# Patient Record
Sex: Female | Born: 1980 | Race: Black or African American | Marital: Single | Smoking: Current every day smoker
Health system: Southern US, Community
[De-identification: ages and names within clinical notes are randomized; demographics above are authoritative.]

## PROBLEM LIST (undated history)

## (undated) DIAGNOSIS — F329 Major depressive disorder, single episode, unspecified: Secondary | ICD-10-CM

## (undated) DIAGNOSIS — F319 Bipolar disorder, unspecified: Secondary | ICD-10-CM

## (undated) DIAGNOSIS — J45909 Unspecified asthma, uncomplicated: Secondary | ICD-10-CM

## (undated) DIAGNOSIS — F419 Anxiety disorder, unspecified: Secondary | ICD-10-CM

## (undated) DIAGNOSIS — F32A Depression, unspecified: Secondary | ICD-10-CM

## (undated) HISTORY — PX: HERNIA REPAIR: SHX51

## (undated) HISTORY — PX: GANGLION CYST EXCISION: SHX1691

---

## 2012-02-21 ENCOUNTER — Encounter (HOSPITAL_COMMUNITY): Payer: Self-pay | Admitting: *Deleted

## 2012-02-21 ENCOUNTER — Emergency Department (HOSPITAL_COMMUNITY)
Admission: EM | Admit: 2012-02-21 | Discharge: 2012-02-21 | Disposition: A | Discharge status: Home / Self Care | Payer: Medicaid Other | Attending: Emergency Medicine | Admitting: Emergency Medicine

## 2012-02-21 DIAGNOSIS — S0501XA Injury of conjunctiva and corneal abrasion without foreign body, right eye, initial encounter: Secondary | ICD-10-CM

## 2012-02-21 DIAGNOSIS — H18829 Corneal disorder due to contact lens, unspecified eye: Secondary | ICD-10-CM | POA: Insufficient documentation

## 2012-02-21 DIAGNOSIS — Z888 Allergy status to other drugs, medicaments and biological substances status: Secondary | ICD-10-CM | POA: Insufficient documentation

## 2012-02-21 DIAGNOSIS — S058X9A Other injuries of unspecified eye and orbit, initial encounter: Secondary | ICD-10-CM | POA: Insufficient documentation

## 2012-02-21 MED ORDER — OXYCODONE-ACETAMINOPHEN 10-325 MG PO TABS: 1.0000 | ORAL_TABLET | ORAL | Status: DC | PRN

## 2012-02-21 MED ORDER — FLUORESCEIN SODIUM 1 MG OP STRP
1.0000 | ORAL_STRIP | Freq: Once | OPHTHALMIC | Status: AC
  Administered 2012-02-21: 2 via OPHTHALMIC
  Filled 2012-02-21: qty 2

## 2012-02-21 MED ORDER — IBUPROFEN 400 MG PO TABS
400.0000 mg | ORAL_TABLET | Freq: Once | ORAL | Status: AC
  Administered 2012-02-21: 400 mg via ORAL
  Filled 2012-02-21: qty 1

## 2012-02-21 MED ORDER — ONDANSETRON 4 MG PO TBDP
8.0000 mg | ORAL_TABLET | Freq: Once | ORAL | Status: AC
  Administered 2012-02-21: 8 mg via ORAL
  Filled 2012-02-21: qty 2

## 2012-02-21 MED ORDER — TOBRAMYCIN 0.3 % OP SOLN
2.0000 [drp] | Freq: Four times a day (QID) | OPHTHALMIC | Status: DC
  Administered 2012-02-21: 2 [drp] via OPHTHALMIC
  Filled 2012-02-21: qty 5

## 2012-02-21 MED ORDER — TETRACAINE HCL 0.5 % OP SOLN
2.0000 [drp] | Freq: Once | OPHTHALMIC | Status: AC
  Administered 2012-02-21: 2 [drp] via OPHTHALMIC
  Filled 2012-02-21: qty 2

## 2012-02-21 MED ORDER — OXYCODONE-ACETAMINOPHEN 5-325 MG PO TABS
1.0000 | ORAL_TABLET | Freq: Once | ORAL | Status: AC
  Administered 2012-02-21: 1 via ORAL
  Filled 2012-02-21: qty 1

## 2012-02-21 NOTE — ED Provider Notes (Signed)
Medical screening examination/treatment/procedure(s) were performed by non-physician practitioner and as supervising physician I was immediately available for consultation/collaboration.  Gianlucas Evenson M Ketsia Linebaugh, MD 02/21/12 0628 

## 2012-02-21 NOTE — ED Notes (Signed)
PA gave gtt to each eye for continued eye pain

## 2012-02-21 NOTE — ED Notes (Signed)
Pt dc to home.  W/c to car.  Pt a/o states understanding to dc instructions.

## 2012-02-21 NOTE — ED Notes (Signed)
Patient states that she is unable to open her left eye.  Patient informed of the need for a visual acuity screening.  Patient states she is unable to complete one at this time.  RN Roe Coombs informed.

## 2012-02-21 NOTE — ED Notes (Signed)
The pt is c/o bi-lateral eye pain she went to sleep with soft contacts in place .  She woke up   And for  2 hours she has pain and  And cannot see for crying now.

## 2012-02-21 NOTE — ED Provider Notes (Signed)
History     CSN: [id number]  Arrival date & time 02/21/12  4098   First MD Initiated Contact with Patient 02/21/12 0340      Chief Complaint  Patient presents with  . Eye Pain   HPI  History provided by the patient. Patient is a 31 year old female with no significant PMH who presents with complaints of bilateral eye pain. Symptoms began to 3 hours prior to arrival. Patient states that she fell asleep with her contact lenses and awoke with severe pains in both eyes. Pain is worse with bright lights and trying to open her eyes. Symptoms are associated with tearing and blurred vision. Patient also reports slight nausea symptoms. She denies having similar symptoms previously. Patient states her contact lenses were relatively new. She denies any injury or trauma.    History reviewed. No pertinent past medical history.  History reviewed. No pertinent past surgical history.  No family history on file.  History  Substance Use Topics  . Smoking status: Never Smoker   . Smokeless tobacco: Not on file  . Alcohol Use: No    OB History    Grav Para Term Preterm Abortions TAB SAB Ect Mult Living                  Review of Systems  Constitutional: Negative for fever and chills.  HENT: Positive for rhinorrhea.   Eyes: Positive for photophobia, pain, discharge and redness. Negative for itching.  Gastrointestinal: Positive for nausea. Negative for vomiting and abdominal pain.  Skin: Negative for rash.  Neurological: Positive for headaches.    Allergies  Tramadol  Home Medications  No current outpatient prescriptions on file.  BP 123/87  Pulse 92  Temp 97.9 F (36.6 C) (Oral)  Resp 18  SpO2 98%  Physical Exam  Nursing note and vitals reviewed. Constitutional: She is oriented to person, place, and time. She appears well-developed and well-nourished. No distress.  HENT:  Head: Normocephalic.  Eyes: EOM are normal. Pupils are equal, round, and reactive to light. Right eye  exhibits discharge. Right eye exhibits no chemosis and no exudate. Left eye exhibits discharge. Left eye exhibits no chemosis and no exudate. Right conjunctiva is injected. Right conjunctiva has no hemorrhage. Left conjunctiva is injected. Left conjunctiva has no hemorrhage.         There is fluorescein uptake around the superior edges of the cornea on bilateral eyes.  Cardiovascular: Normal rate and regular rhythm.   Pulmonary/Chest: Effort normal and breath sounds normal.  Abdominal: Soft.  Neurological: She is alert and oriented to person, place, and time.  Skin: Skin is warm and dry. No rash noted.  Psychiatric: She has a normal mood and affect. Her behavior is normal.    ED Course  Procedures     1. Corneal abrasion due to contact lens   2. Corneal abrasion of both eyes       MDM  3:50AM patient seen and evaluated. Patient appears significantly uncomfortable.   Patient having significant improvement after Percocet and tetracaine drops. Patient now resting and sleeping.     Angus Seller, [place] 02/21/12 [phone]

## 2012-09-17 ENCOUNTER — Encounter (HOSPITAL_COMMUNITY): Payer: Self-pay | Admitting: Adult Health

## 2012-09-17 ENCOUNTER — Emergency Department (HOSPITAL_COMMUNITY)
Admission: EM | Admit: 2012-09-17 | Discharge: 2012-09-17 | Disposition: A | Discharge status: Home / Self Care | Payer: Self-pay | Attending: Emergency Medicine | Admitting: Emergency Medicine

## 2012-09-17 DIAGNOSIS — L0291 Cutaneous abscess, unspecified: Secondary | ICD-10-CM

## 2012-09-17 DIAGNOSIS — J45909 Unspecified asthma, uncomplicated: Secondary | ICD-10-CM | POA: Insufficient documentation

## 2012-09-17 DIAGNOSIS — L0231 Cutaneous abscess of buttock: Secondary | ICD-10-CM | POA: Insufficient documentation

## 2012-09-17 DIAGNOSIS — F17200 Nicotine dependence, unspecified, uncomplicated: Secondary | ICD-10-CM | POA: Insufficient documentation

## 2012-09-17 DIAGNOSIS — R6883 Chills (without fever): Secondary | ICD-10-CM | POA: Insufficient documentation

## 2012-09-17 DIAGNOSIS — R110 Nausea: Secondary | ICD-10-CM | POA: Insufficient documentation

## 2012-09-17 HISTORY — DX: Unspecified asthma, uncomplicated: J45.909

## 2012-09-17 MED ORDER — HYDROCODONE-ACETAMINOPHEN 5-325 MG PO TABS: 1.0000 | ORAL_TABLET | ORAL | Status: DC | PRN

## 2012-09-17 MED ORDER — OXYCODONE-ACETAMINOPHEN 5-325 MG PO TABS
1.0000 | ORAL_TABLET | Freq: Once | ORAL | Status: AC
  Administered 2012-09-17: 1 via ORAL
  Filled 2012-09-17: qty 1

## 2012-09-17 MED ORDER — ONDANSETRON 4 MG PO TBDP
8.0000 mg | ORAL_TABLET | Freq: Once | ORAL | Status: AC
  Administered 2012-09-17: 8 mg via ORAL
  Filled 2012-09-17: qty 2

## 2012-09-17 MED ORDER — SULFAMETHOXAZOLE-TRIMETHOPRIM 800-160 MG PO TABS: 1.0000 | ORAL_TABLET | Freq: Two times a day (BID) | ORAL | Status: DC

## 2012-09-17 NOTE — ED Notes (Signed)
Pt reports being bitten on right buttock by "a bug" yesterday. States that pain around bug bite has gotten worse and pain is now radiating to lower back and right lower leg. Reports taking ibuprofen yesterday with no relief and received a percocet while in triage and still is in severe pain.

## 2012-09-17 NOTE — ED Provider Notes (Signed)
History    This chart was scribed for non-physician practitioner Dahlia Client Michelle Mills working with Michelle Human, MD by Quintella Reichert, ED Scribe. This patient was seen in room TR08C/TR08C and the patient's care was started at 8:36 PM .   CSN: [id number]  Arrival date & time 09/17/12  1800       Chief Complaint  Patient presents with  . Insect Bite     The history is provided by the patient. No language interpreter was used.    HPI Comments: Michelle Mills is a 32 y.o. female who presents to the Emergency Department complaining of an apparent animal bite that pt sustained 2 nights ago.  Pt states that sore is painful and that area around it is tender.  She also notes chills and nausea.  Pt denies fever or any other symptoms.  She attempted to treat pain with ibuprofen and hot compress, which did not relieve pain.  She states pain was exacerbated by hot compress.  She reports a h/o similar symptoms associated with a spider bite that pt sustained in the past, which was successfully treated with antibiotics.  Pt has asthma but has not been medicated for this recently.  Pt is allergic to tramadol.   Past Medical History  Diagnosis Date  . Asthma     History reviewed. No pertinent past surgical history.  History reviewed. No pertinent family history.  History  Substance Use Topics  . Smoking status: Current Every Day Smoker    Types: Cigarettes  . Smokeless tobacco: Not on file  . Alcohol Use: No    OB History   Grav Para Term Preterm Abortions TAB SAB Ect Mult Living                  Review of Systems  Constitutional: Positive for chills. Negative for fever.  Gastrointestinal: Positive for nausea.  Skin: Positive for wound.  All other systems reviewed and are negative.    Allergies  Tramadol  Home Medications   Current Outpatient Rx  Name  Route  Sig  Dispense  Refill  . etonogestrel (IMPLANON) 68 MG IMPL implant   Subcutaneous   Inject 1 each into the  skin once.         Marland Kitchen HYDROcodone-acetaminophen (NORCO/VICODIN) 5-325 MG per tablet   Oral   Take 1 tablet by mouth every 4 (four) hours as needed for pain.   6 tablet   0   . sulfamethoxazole-trimethoprim (SEPTRA DS) 800-160 MG per tablet   Oral   Take 1 tablet by mouth every 12 (twelve) hours.   20 tablet   0     BP 120/67  Pulse 90  Temp(Src) 97.7 F (36.5 C) (Oral)  Resp 16  SpO2 99%  Physical Exam  Nursing note and vitals reviewed. Constitutional: She is oriented to person, place, and time. She appears well-developed and well-nourished. No distress.  HENT:  Head: Normocephalic and atraumatic.  Mouth/Throat: Oropharynx is clear and moist. No oropharyngeal exudate.  Eyes: Conjunctivae are normal. No scleral icterus.  Neck: Normal range of motion.  Cardiovascular: Normal rate, regular rhythm, normal heart sounds and intact distal pulses.   No murmur heard. Pulmonary/Chest: Effort normal and breath sounds normal. No respiratory distress. She has no wheezes.  Musculoskeletal: Normal range of motion. She exhibits no edema.  Neurological: She is alert and oriented to person, place, and time. Coordination normal.  Speech is clear and goal oriented Moves extremities without ataxia  Skin: Skin is warm  and dry. She is not diaphoretic. There is erythema.  3x3 cm area of erythema and induration top of right buttock, tender to palpation.  Mild excoriation.  Psychiatric: She has a normal mood and affect.    ED Course  INCISION AND DRAINAGE Date/Time: 09/17/2012 9:19 PM Performed by: Dierdre Forth Authorized by: Dierdre Forth Consent: Verbal consent obtained. Risks and benefits: risks, benefits and alternatives were discussed Consent given by: patient Patient understanding: patient states understanding of the procedure being performed Patient consent: the patient's understanding of the procedure matches consent given Procedure consent: procedure consent matches  procedure scheduled Relevant documents: relevant documents present and verified Site marked: the operative site was marked Required items: required blood products, implants, devices, and special equipment available Patient identity confirmed: verbally with patient and arm band Type: abscess Body area: anogenital (right buttock) Anesthesia: local infiltration Local anesthetic: lidocaine 1% without epinephrine Anesthetic total: 4 ml Patient sedated: no Scalpel size: 11 Incision type: single straight Complexity: complex Drainage: purulent Drainage amount: moderate Wound treatment: wound left open Patient tolerance: Patient tolerated the procedure well with no immediate complications.   (including critical care time)  DIAGNOSTIC STUDIES: Oxygen Saturation is 99% on room air, normal by my interpretation.    COORDINATION OF CARE: 8:40 PM-Discussed treatment plan which includes anti-emetics, labs and incision and drainage with pt at bedside and pt agreed to plan.      Labs Reviewed  GLUCOSE, CAPILLARY   No results found.   1. Abscess       MDM  Teana Lindahl presents with skin abscess that could have been an insect bite, but does not have necrotic tissue suggestive of brown recluse bite.  Patient with skin abscess amenable to incision and drainage.  Abscess was not large enough to warrant packing or drain,  wound recheck in 2 days. Encouraged home warm soaks and flushing.  Mild signs of cellulitis is surrounding skin.  Will d/c to home with bactrim and pain medication.  Pt without Hx of diabetes and CBG normal here.  Pt with hx of asthma, but no steroid use, no immunomodulators and no risky behaviors concerning for HIV.  I have also discussed reasons to return immediately to the ER.  Patient expresses understanding and agrees with plan.   I personally performed the services described in this documentation, which was scribed in my presence. The recorded information has been  reviewed and is accurate.   Dahlia Client Michelle Fines, PA-C 09/17/12 2122

## 2012-09-17 NOTE — ED Notes (Signed)
Presents with induration to right buttock that began weds. Pt staes, "it started small and then yesterday it looked like I had scratched it, but I didin't and today there is so much pain I had to call out of work" no drainage noted.

## 2012-09-18 NOTE — ED Provider Notes (Signed)
Medical screening examination/treatment/procedure(s) were performed by non-physician practitioner and as supervising physician I was immediately available for consultation/collaboration.   Efosa Treichler III, MD 09/18/12 1228 

## 2014-03-29 ENCOUNTER — Emergency Department (HOSPITAL_COMMUNITY)
Admission: EM | Admit: 2014-03-29 | Discharge: 2014-03-30 | Disposition: A | Discharge status: Home / Self Care | Payer: Medicaid Other | Attending: Emergency Medicine | Admitting: Emergency Medicine

## 2014-03-29 ENCOUNTER — Emergency Department (HOSPITAL_COMMUNITY): Payer: Medicaid Other

## 2014-03-29 ENCOUNTER — Encounter (HOSPITAL_COMMUNITY): Payer: Self-pay | Admitting: Emergency Medicine

## 2014-03-29 DIAGNOSIS — R1310 Dysphagia, unspecified: Secondary | ICD-10-CM | POA: Insufficient documentation

## 2014-03-29 DIAGNOSIS — R072 Precordial pain: Secondary | ICD-10-CM | POA: Insufficient documentation

## 2014-03-29 DIAGNOSIS — Z3202 Encounter for pregnancy test, result negative: Secondary | ICD-10-CM | POA: Insufficient documentation

## 2014-03-29 DIAGNOSIS — J45909 Unspecified asthma, uncomplicated: Secondary | ICD-10-CM | POA: Insufficient documentation

## 2014-03-29 DIAGNOSIS — R079 Chest pain, unspecified: Secondary | ICD-10-CM

## 2014-03-29 DIAGNOSIS — R1013 Epigastric pain: Secondary | ICD-10-CM | POA: Insufficient documentation

## 2014-03-29 DIAGNOSIS — Z720 Tobacco use: Secondary | ICD-10-CM | POA: Insufficient documentation

## 2014-03-29 LAB — LIPASE, BLOOD: Lipase: 16 U/L (ref 11–59)

## 2014-03-29 LAB — BASIC METABOLIC PANEL
Anion gap: 12 (ref 5–15)
BUN: 12 mg/dL (ref 6–23)
CHLORIDE: 104 meq/L (ref 96–112)
CO2: 26 mEq/L (ref 19–32)
CREATININE: 0.65 mg/dL (ref 0.50–1.10)
Calcium: 9.1 mg/dL (ref 8.4–10.5)
GFR calc non Af Amer: >90 mL/min (ref >90)
Glucose, Bld: 93 mg/dL (ref 70–99)
Potassium: 3.7 mEq/L (ref 3.7–5.3)
Sodium: 142 mEq/L (ref 137–147)

## 2014-03-29 LAB — URINALYSIS, ROUTINE W REFLEX MICROSCOPIC
Bilirubin Urine: NEGATIVE
GLUCOSE, UA: NEGATIVE mg/dL
HGB URINE DIPSTICK: NEGATIVE
Ketones, ur: NEGATIVE mg/dL
Leukocytes, UA: NEGATIVE
Nitrite: NEGATIVE
Protein, ur: 100 mg/dL — AB
SPECIFIC GRAVITY, URINE: 1.025 (ref 1.005–1.030)
Urobilinogen, UA: 1 mg/dL (ref 0.0–1.0)
pH: 8.5 — ABNORMAL HIGH (ref 5.0–8.0)

## 2014-03-29 LAB — CBC
HEMATOCRIT: 37.1 % (ref 36.0–46.0)
HEMOGLOBIN: 12.3 g/dL (ref 12.0–15.0)
MCH: 28.1 pg (ref 26.0–34.0)
MCHC: 33.2 g/dL (ref 30.0–36.0)
MCV: 84.7 fL (ref 78.0–100.0)
Platelets: 257 10*3/uL (ref 150–400)
RBC: 4.38 MIL/uL (ref 3.87–5.11)
RDW: 13.9 % (ref 11.5–15.5)
WBC: 8 10*3/uL (ref 4.0–10.5)

## 2014-03-29 LAB — HEPATIC FUNCTION PANEL
ALBUMIN: 3.4 g/dL — AB (ref 3.5–5.2)
ALK PHOS: 75 U/L (ref 39–117)
ALT: 12 U/L (ref 0–35)
AST: 13 U/L (ref 0–37)
Bilirubin, Direct: <0.2 mg/dL (ref 0.0–0.3)
TOTAL PROTEIN: 6.7 g/dL (ref 6.0–8.3)
Total Bilirubin: <0.2 mg/dL — ABNORMAL LOW (ref 0.3–1.2)

## 2014-03-29 LAB — URINE MICROSCOPIC-ADD ON

## 2014-03-29 LAB — POC URINE PREG, ED: Preg Test, Ur: NEGATIVE

## 2014-03-29 LAB — I-STAT TROPONIN, ED: TROPONIN I, POC: 0.01 ng/mL (ref 0.00–0.08)

## 2014-03-29 LAB — PRO B NATRIURETIC PEPTIDE: Pro B Natriuretic peptide (BNP): 93.1 pg/mL (ref 0–125)

## 2014-03-29 MED ORDER — GI COCKTAIL ~~LOC~~
30.0000 mL | Freq: Once | ORAL | Status: AC
  Administered 2014-03-29: 30 mL via ORAL
  Filled 2014-03-29: qty 30

## 2014-03-29 MED ORDER — PANTOPRAZOLE SODIUM 40 MG PO TBEC
40.0000 mg | DELAYED_RELEASE_TABLET | Freq: Once | ORAL | Status: AC
  Administered 2014-03-29: 40 mg via ORAL
  Filled 2014-03-29: qty 1

## 2014-03-29 MED ORDER — MORPHINE SULFATE 4 MG/ML IJ SOLN: 4.0000 mg | Freq: Once | INTRAMUSCULAR | Status: DC

## 2014-03-29 MED ORDER — MORPHINE SULFATE 4 MG/ML IJ SOLN
6.0000 mg | Freq: Once | INTRAMUSCULAR | Status: AC
  Administered 2014-03-29: 6 mg via INTRAMUSCULAR
  Filled 2014-03-29: qty 2

## 2014-03-29 NOTE — ED Provider Notes (Signed)
CSN: [id number]     Arrival date & time 03/29/14  1739 History   First MD Initiated Contact with Patient 03/29/14 2128     Chief Complaint  Patient presents with  . Chest Pain     (Consider location/radiation/quality/duration/timing/severity/associated sxs/prior Treatment) Patient is a 33 y.o. female presenting with chest pain.  Chest Pain Pain location:  Substernal area Pain quality: aching and pressure   Pain radiates to:  Upper back Pain radiates to the back: yes   Pain severity:  Moderate Onset quality:  Sudden Duration:  14 hours Timing:  Constant Progression:  Unchanged Chronicity:  New Context: at rest   Relieved by:  Nothing Worsened by:  Deep breathing (eating and drinking, swallowing) Associated symptoms: abdominal pain and dysphagia   Associated symptoms: no anorexia, no anxiety, no back pain, no claudication, no cough, no diaphoresis, no fatigue, no fever, no headache, no lower extremity edema, no nausea, no numbness, no palpitations, no shortness of breath, not vomiting and no weakness   Risk factors: smoking   Risk factors: no aortic disease, no birth control, no coronary artery disease, no diabetes mellitus, no high cholesterol, no hypertension, no immobilization, not female, not obese, not pregnant and no prior DVT/PE     Past Medical History  Diagnosis Date  . Asthma    History reviewed. No pertinent past surgical history. No family history on file. History  Substance Use Topics  . Smoking status: Current Every Day Smoker -- 0.50 packs/day    Types: Cigarettes  . Smokeless tobacco: Not on file  . Alcohol Use: No   OB History    No data available     Review of Systems  Constitutional: Negative for fever, chills, diaphoresis, activity change, appetite change and fatigue.  HENT: Positive for trouble swallowing. Negative for congestion, facial swelling, rhinorrhea and sore throat.   Eyes: Negative for photophobia and discharge.  Respiratory: Negative  for cough, chest tightness and shortness of breath.   Cardiovascular: Positive for chest pain. Negative for palpitations, claudication and leg swelling.  Gastrointestinal: Positive for abdominal pain. Negative for nausea, vomiting, diarrhea and anorexia.  Endocrine: Negative for polydipsia and polyuria.  Genitourinary: Negative for dysuria, frequency, difficulty urinating and pelvic pain.  Musculoskeletal: Negative for back pain, arthralgias, neck pain and neck stiffness.  Skin: Negative for color change and wound.  Allergic/Immunologic: Negative for immunocompromised state.  Neurological: Negative for facial asymmetry, weakness, numbness and headaches.  Hematological: Does not bruise/bleed easily.  Psychiatric/Behavioral: Negative for confusion and agitation.      Allergies  Tramadol  Home Medications   Prior to Admission medications   Medication Sig Start Date End Date Taking? Authorizing Provider  etonogestrel (IMPLANON) 68 MG IMPL implant Inject 1 each into the skin once.   Yes Historical Provider, MD  Hyprom-Naphaz-Polysorb-Zn Sulf (CLEAR EYES COMPLETE OP) Apply 1-2 drops to eye daily as needed (dry eyes/contacts.).   Yes Historical Provider, MD  ibuprofen (ADVIL,MOTRIN) 200 MG tablet Take 600 mg by mouth every 4 (four) hours as needed for headache or moderate pain.   Yes Historical Provider, MD  HYDROcodone-acetaminophen (NORCO/VICODIN) 5-325 MG per tablet Take 1 tablet by mouth every 4 (four) hours as needed for pain. Patient not taking: Reported on 03/29/2014 09/17/12   Dahlia Client Muthersbaugh, PA-C  sulfamethoxazole-trimethoprim (SEPTRA DS) 800-160 MG per tablet Take 1 tablet by mouth every 12 (twelve) hours. Patient not taking: Reported on 03/29/2014 09/17/12   Dahlia Client Muthersbaugh, PA-C   BP 120/83 mmHg  Pulse 78  Temp(Src) 97.9 F (36.6 C) (Oral)  Resp 18  SpO2 100%  LMP 01/27/2014 Physical Exam  Constitutional: She is oriented to person, place, and time. She appears  well-developed and well-nourished. No distress.  HENT:  Head: Normocephalic and atraumatic.  Mouth/Throat: No oropharyngeal exudate.  Eyes: Pupils are equal, round, and reactive to light.  Neck: Normal range of motion. Neck supple.  Cardiovascular: Normal rate, regular rhythm and normal heart sounds.  Exam reveals no gallop and no friction rub.   No murmur heard. Pulmonary/Chest: Effort normal and breath sounds normal. No respiratory distress. She has no wheezes. She has no rales.    Abdominal: Soft. Bowel sounds are normal. She exhibits no distension and no mass. There is tenderness in the epigastric area. There is no rebound and no guarding.  Musculoskeletal: Normal range of motion. She exhibits no edema or tenderness.  Neurological: She is alert and oriented to person, place, and time.  Skin: Skin is warm and dry.  Psychiatric: She has a normal mood and affect.    ED Course  Procedures (including critical care time) Labs Review Labs Reviewed  URINALYSIS, ROUTINE W REFLEX MICROSCOPIC - Abnormal; Notable for the following:    APPearance TURBID (*)    pH 8.5 (*)    Protein, ur 100 (*)    All other components within normal limits  URINE MICROSCOPIC-ADD ON - Abnormal; Notable for the following:    Bacteria, UA MANY (*)    Casts HYALINE CASTS (*)    All other components within normal limits  PRO B NATRIURETIC PEPTIDE  BASIC METABOLIC PANEL  CBC  HEPATIC FUNCTION PANEL  LIPASE, BLOOD  I-STAT TROPOININ, ED  POC URINE PREG, ED    Imaging Review Dg Chest 2 View  03/29/2014   CLINICAL DATA:  Mid chest pain and pain under both breasts, shortness of breath and cough since this morning, smoker, history asthma, initial encounter  EXAM: CHEST  2 VIEW  COMPARISON:  None  FINDINGS: Normal heart size, mediastinal contours, and pulmonary vascularity.  Lungs clear.  No pneumothorax.  Bones unremarkable.  IMPRESSION: Normal exam.   Electronically Signed   By: Ulyses SouthwardMark  Boles M.D.   On: 03/29/2014  22:15     EKG Interpretation   Date/Time:  Wednesday March 29 2014 17:46:17 EST Ventricular Rate:  90 PR Interval:  137 QRS Duration: 83 QT Interval:  349 QTC Calculation: 427 R Axis:   21 Text Interpretation:  Sinus rhythm Abnormal ekg Confirmed by Shevawn Langenberg  MD,  Rayleigh Gillyard ([id number]) on 03/29/2014 11:25:26 PM      MDM   Final diagnoses:  Chest pain   Pt is a 33 y.o. female with Pmhx as above who presents with substernal CP since 9am, constant, worse with swallowing, eating, drinking or taking deep breaths.  She denies fevers, chills, coughing, shortness of breath, leg pain or swelling.  She's had no suspicious food intake, possible sick contacts at work.  On physical exam vital signs are stable.  Patient's in no acute distress.  She is reproducible central chest pain as well as epigastric pain.  Oropharynx is clear.  EKG has no acute ischemic changes.  Troponin is negative.  Chest x-ray is normal.  BNP is not elevated.  Given that the pain is worse when she eats or drinks I suspect that is GI in nature.  I doubt PE, given normal heart rate, no shortness of breath, no leg pain or swelling and no recent travel history.  Protonix and a  GI cocktail given without improvement. IM morphine given  12:03 AM Pt is improving, though pt feels nauseated. Will give second dose morphine & add zofran.    12:43 AM Pt feeling improved, has tolerated soda and crackers. Lipase & LFTs nml. Will d/c home w/ norco and protonix for symptoms.  Return precautions given for new or worsening symptoms including worsneing pain, fever, SOB, inability to tolerate liquids.    Toy CookeyMegan Reyanne Hussar, MD 03/30/14 [phone]

## 2014-03-29 NOTE — ED Notes (Signed)
Per pt left side chest pain starting 0900 today. Pt reports dizziness, SOB, nausea, and back pain associated with left side chest pain. Pt alert and oriented x4.

## 2014-03-30 MED ORDER — MORPHINE SULFATE 4 MG/ML IJ SOLN
6.0000 mg | Freq: Once | INTRAMUSCULAR | Status: AC
  Administered 2014-03-30: 6 mg via INTRAMUSCULAR
  Filled 2014-03-30: qty 2

## 2014-03-30 MED ORDER — PANTOPRAZOLE SODIUM 40 MG PO TBEC: 40.0000 mg | DELAYED_RELEASE_TABLET | Freq: Every day | ORAL | Status: DC

## 2014-03-30 MED ORDER — HYDROCODONE-ACETAMINOPHEN 5-325 MG PO TABS: 1.0000 | ORAL_TABLET | Freq: Four times a day (QID) | ORAL | Status: AC | PRN

## 2014-03-30 MED ORDER — PANTOPRAZOLE SODIUM 40 MG PO TBEC: 40.0000 mg | DELAYED_RELEASE_TABLET | Freq: Every day | ORAL | Status: AC

## 2014-03-30 MED ORDER — ONDANSETRON HCL 4 MG/2ML IJ SOLN: 4.0000 mg | Freq: Once | INTRAMUSCULAR | Status: DC

## 2014-03-30 MED ORDER — ONDANSETRON 4 MG PO TBDP
4.0000 mg | ORAL_TABLET | Freq: Once | ORAL | Status: AC
  Administered 2014-03-30: 4 mg via ORAL
  Filled 2014-03-30: qty 1

## 2014-03-30 MED ORDER — HYDROCODONE-ACETAMINOPHEN 5-325 MG PO TABS: 1.0000 | ORAL_TABLET | Freq: Four times a day (QID) | ORAL | Status: DC | PRN

## 2014-03-30 MED ORDER — MORPHINE SULFATE 4 MG/ML IJ SOLN: 4.0000 mg | Freq: Once | INTRAMUSCULAR | Status: DC

## 2014-07-09 ENCOUNTER — Emergency Department (HOSPITAL_COMMUNITY)
Admission: EM | Admit: 2014-07-09 | Discharge: 2014-07-09 | Disposition: A | Discharge status: Home / Self Care | Payer: Medicaid Other | Attending: Emergency Medicine | Admitting: Emergency Medicine

## 2014-07-09 ENCOUNTER — Encounter (HOSPITAL_COMMUNITY): Payer: Self-pay | Admitting: Emergency Medicine

## 2014-07-09 DIAGNOSIS — Z79899 Other long term (current) drug therapy: Secondary | ICD-10-CM | POA: Insufficient documentation

## 2014-07-09 DIAGNOSIS — Z720 Tobacco use: Secondary | ICD-10-CM | POA: Insufficient documentation

## 2014-07-09 DIAGNOSIS — Z8659 Personal history of other mental and behavioral disorders: Secondary | ICD-10-CM | POA: Insufficient documentation

## 2014-07-09 DIAGNOSIS — J45909 Unspecified asthma, uncomplicated: Secondary | ICD-10-CM | POA: Insufficient documentation

## 2014-07-09 DIAGNOSIS — H6503 Acute serous otitis media, bilateral: Secondary | ICD-10-CM | POA: Insufficient documentation

## 2014-07-09 DIAGNOSIS — J029 Acute pharyngitis, unspecified: Secondary | ICD-10-CM | POA: Insufficient documentation

## 2014-07-09 HISTORY — DX: Depression, unspecified: F32.A

## 2014-07-09 HISTORY — DX: Anxiety disorder, unspecified: F41.9

## 2014-07-09 HISTORY — DX: Bipolar disorder, unspecified: F31.9

## 2014-07-09 HISTORY — DX: Major depressive disorder, single episode, unspecified: F32.9

## 2014-07-09 LAB — RAPID STREP SCREEN (MED CTR MEBANE ONLY): STREPTOCOCCUS, GROUP A SCREEN (DIRECT): NEGATIVE

## 2014-07-09 MED ORDER — NAPROXEN 500 MG PO TABS: 500.0000 mg | ORAL_TABLET | Freq: Two times a day (BID) | ORAL | Status: AC

## 2014-07-09 MED ORDER — OXYCODONE-ACETAMINOPHEN 5-325 MG PO TABS: 1.0000 | ORAL_TABLET | ORAL | Status: AC | PRN

## 2014-07-09 MED ORDER — AMOXICILLIN-POT CLAVULANATE 875-125 MG PO TABS
1.0000 | ORAL_TABLET | Freq: Two times a day (BID) | ORAL | Status: AC
Start: 2014-07-09 — End: ?

## 2014-07-09 NOTE — Discharge Instructions (Signed)
You will need to follow up with your regular doctor in one week to be sure the ear infection is improving. Return here as needed for worsening symptoms.   Otitis Media Otitis media is redness, soreness, and inflammation of the middle ear. Otitis media may be caused by allergies or, most commonly, by infection. Often it occurs as a complication of the common cold. SIGNS AND SYMPTOMS Symptoms of otitis media may include:  Earache.  Fever.  Ringing in your ear.  Headache.  Leakage of fluid from the ear. DIAGNOSIS To diagnose otitis media, your health care provider will examine your ear with an otoscope. This is an instrument that allows your health care provider to see into your ear in order to examine your eardrum. Your health care provider also will ask you questions about your symptoms. TREATMENT  Typically, otitis media resolves on its own within 3-5 days. Your health care provider may prescribe medicine to ease your symptoms of pain. If otitis media does not resolve within 5 days or is recurrent, your health care provider may prescribe antibiotic medicines if he or she suspects that a bacterial infection is the cause. HOME CARE INSTRUCTIONS   If you were prescribed an antibiotic medicine, finish it all even if you start to feel better.  Take medicines only as directed by your health care provider.  Keep all follow-up visits as directed by your health care provider. SEEK MEDICAL CARE IF:  You have otitis media only in one ear, or bleeding from your nose, or both.  You notice a lump on your neck.  You are not getting better in 3-5 days.  You feel worse instead of better. SEEK IMMEDIATE MEDICAL CARE IF:   You have pain that is not controlled with medicine.  You have swelling, redness, or pain around your ear or stiffness in your neck.  You notice that part of your face is paralyzed.  You notice that the bone behind your ear (mastoid) is tender when you touch it. MAKE SURE  YOU:   Understand these instructions.  Will watch your condition.  Will get help right away if you are not doing well or get worse. Document Released: 02/01/2004 Document Revised: 09/12/2013 Document Reviewed: 11/23/2012 [facility] Patient Information 2015 [place], [place]. This information is not intended to replace advice given to you by your health care provider. Make sure you discuss any questions you have with your health care provider.

## 2014-07-09 NOTE — ED Notes (Signed)
Pt reports sore throat x 6 days, and severe bilateral ear pain since this am. tx with motrin. Denies fever

## 2014-07-09 NOTE — ED Provider Notes (Signed)
CSN: [id number]     Arrival date & time 07/09/14  1045 History   First MD Initiated Contact with Patient 07/09/14 1105     Chief Complaint  Patient presents with  . Otalgia    bilateral ear pain  . Sore Throat    x 6 days     (Consider location/radiation/quality/duration/timing/severity/associated sxs/prior Treatment) Patient is a 34 y.o. female presenting with ear pain and pharyngitis. The history is provided by the patient.  Otalgia Location:  Bilateral Quality:  Aching Severity:  Moderate Onset quality:  Gradual Duration:  6 days Timing:  Constant Progression:  Worsening Associated symptoms: congestion and sore throat   Sore Throat Associated symptoms include congestion and a sore throat.  Lakeishia Truluck is a 34 y.o. female who presents to the ED with sore throat that started 6 days ago followed by ear pain. Today the right ear pain is severe. Patient crying with pain. She has been taking OTC medications without relief.   Past Medical History  Diagnosis Date  . Asthma   . Anxiety   . Depression   . Bipolar 1 disorder    Past Surgical History  Procedure Laterality Date  . Cesarean section    . Ganglion cyst excision      r/wrist  . Hernia repair     Family History  Problem Relation Age of Onset  . Diabetes Mother   . Hypertension Mother   . Heart failure Mother    History  Substance Use Topics  . Smoking status: Current Every Day Smoker -- 0.50 packs/day    Types: Cigarettes  . Smokeless tobacco: Not on file  . Alcohol Use: Yes   OB History    No data available     Review of Systems  HENT: Positive for congestion, ear pain and sore throat.    All other systems negative   Allergies  Tramadol  Home Medications   Prior to Admission medications   Medication Sig Start Date End Date Taking? Authorizing Provider  amoxicillin-clavulanate (AUGMENTIN) 875-125 MG per tablet Take 1 tablet by mouth 2 (two) times daily. 07/09/14   Hope Orlene Och, NP   etonogestrel (IMPLANON) 68 MG IMPL implant Inject 1 each into the skin once.    Historical Provider, MD  HYDROcodone-acetaminophen (NORCO) 5-325 MG per tablet Take 1 tablet by mouth every 6 (six) hours as needed. 03/30/14   Lyanne Co, MD  Hyprom-Naphaz-Polysorb-Zn Sulf (CLEAR EYES COMPLETE OP) Apply 1-2 drops to eye daily as needed (dry eyes/contacts.).    Historical Provider, MD  ibuprofen (ADVIL,MOTRIN) 200 MG tablet Take 600 mg by mouth every 4 (four) hours as needed for headache or moderate pain.    Historical Provider, MD  naproxen (NAPROSYN) 500 MG tablet Take 1 tablet (500 mg total) by mouth 2 (two) times daily. 07/09/14   Hope Orlene Och, NP  oxyCODONE-acetaminophen (ROXICET) 5-325 MG per tablet Take 1 tablet by mouth every 4 (four) hours as needed for severe pain. 07/09/14   Hope Orlene Och, NP  pantoprazole (PROTONIX) 40 MG tablet Take 1 tablet (40 mg total) by mouth daily. 03/30/14   Lyanne Co, MD   BP 114/73 mmHg  Pulse 86  Temp(Src) 98.1 F (36.7 C) (Oral)  Resp 20  SpO2 100%  LMP 06/18/2014 Physical Exam  Constitutional: She is oriented to person, place, and time. She appears well-developed and well-nourished. No distress.  HENT:  Head: Normocephalic.  Right Ear: Tympanic membrane is erythematous and bulging.  Left Ear:  Tympanic membrane is erythematous.  Nose: Mucosal edema and rhinorrhea present.  Mouth/Throat: Uvula is midline and mucous membranes are normal. Posterior oropharyngeal erythema present.  Eyes: EOM are normal.  Neck: Neck supple.  Cardiovascular: Normal rate.   Pulmonary/Chest: Effort normal.  Abdominal: Soft. There is no tenderness.  Musculoskeletal: Normal range of motion.  Neurological: She is alert and oriented to person, place, and time. No cranial nerve deficit.  Skin: Skin is warm and dry.  Psychiatric: She has a normal mood and affect. Her behavior is normal.  Nursing note and vitals reviewed.   ED Course  Procedures (including critical  care time) Labs Review  MDM  34 y.o. female with sore throat and ear ache x 6 days that has gotten worse and today the ear pain is severe. Will treat for otitis media and she will follow up with her PCP in one week to be sure the infection is improving. Discussed with the patient clinical findings and plan of care and all questioned fully answered. She will return if any problems arise.  Final diagnoses:  Otitis media, serous, acute, without rupture, bilateral      Janne NapoleonHope M Neese, NP 07/10/14 1914  Joya Gaskinsonald W Wickline, MD 07/10/14 [phone]

## 2014-07-11 LAB — CULTURE, GROUP A STREP

## 2014-07-12 ENCOUNTER — Encounter (HOSPITAL_COMMUNITY): Payer: Self-pay

## 2014-07-12 ENCOUNTER — Emergency Department (HOSPITAL_COMMUNITY)
Admission: EM | Admit: 2014-07-12 | Discharge: 2014-07-12 | Disposition: A | Discharge status: Home / Self Care | Payer: Medicaid Other | Attending: Emergency Medicine | Admitting: Emergency Medicine

## 2014-07-12 DIAGNOSIS — H9203 Otalgia, bilateral: Secondary | ICD-10-CM | POA: Insufficient documentation

## 2014-07-12 DIAGNOSIS — J111 Influenza due to unidentified influenza virus with other respiratory manifestations: Secondary | ICD-10-CM | POA: Diagnosis not present

## 2014-07-12 DIAGNOSIS — Z720 Tobacco use: Secondary | ICD-10-CM | POA: Diagnosis not present

## 2014-07-12 DIAGNOSIS — Z8659 Personal history of other mental and behavioral disorders: Secondary | ICD-10-CM | POA: Diagnosis not present

## 2014-07-12 DIAGNOSIS — J45909 Unspecified asthma, uncomplicated: Secondary | ICD-10-CM | POA: Diagnosis not present

## 2014-07-12 DIAGNOSIS — R05 Cough: Secondary | ICD-10-CM | POA: Diagnosis present

## 2014-07-12 MED ORDER — PREDNISONE 50 MG PO TABS: 50.0000 mg | ORAL_TABLET | Freq: Every day | ORAL | Status: AC

## 2014-07-12 MED ORDER — KETOROLAC TROMETHAMINE 30 MG/ML IJ SOLN
30.0000 mg | Freq: Once | INTRAMUSCULAR | Status: AC
  Administered 2014-07-12: 30 mg via INTRAVENOUS
  Filled 2014-07-12: qty 1

## 2014-07-12 MED ORDER — ACETAMINOPHEN-CODEINE 120-12 MG/5ML PO SOLN: 10.0000 mL | ORAL | Status: AC | PRN

## 2014-07-12 MED ORDER — GUAIFENESIN ER 1200 MG PO TB12: 1.0000 | ORAL_TABLET | Freq: Two times a day (BID) | ORAL | Status: AC

## 2014-07-12 MED ORDER — SODIUM CHLORIDE 0.9 % IV BOLUS (SEPSIS)
1000.0000 mL | Freq: Once | INTRAVENOUS | Status: AC
  Administered 2014-07-12: 1000 mL via INTRAVENOUS

## 2014-07-12 NOTE — Discharge Instructions (Signed)
Return here as needed.  Increase your fluid intake.  Rest as much as possible. °

## 2014-07-12 NOTE — ED Notes (Signed)
Pt seen x 3 days ago.  Dx ear infection and given antibiotics and pain meds. Pain not getting better.

## 2014-07-12 NOTE — ED Provider Notes (Signed)
CSN: [id number]     Arrival date & time 07/12/14  1742 History  This chart was scribed for non-physician practitioner, Charlestine Nighthristopher Anajulia Leyendecker, PA-C working with Richardean Canalavid H Yao, MD by Gwenyth Oberatherine Macek, ED scribe. This patient was seen in room WTR7/WTR7 and the patient's care was started at 6:53 PM   Chief Complaint  Patient presents with  . Otalgia   The history is provided by the patient. No language interpreter was used.    HPI Comments: Michelle Mills is a 34 y.o. female who presents to the Emergency Department complaining of constant, moderate bilateral ear pain that started 9 days ago. She states cough and generalized body aches as associated symptoms. Pt was seen in the ED 3 days ago for bilateral ear pain, nausea and light-headedness. She was prescribed Amoxicillin, Oxycodone and Naprosyn with no improvement to her symptoms. Pt has not taken the Oxycodone because of an adverse reaction.  Past Medical History  Diagnosis Date  . Asthma   . Anxiety   . Depression   . Bipolar 1 disorder    Past Surgical History  Procedure Laterality Date  . Cesarean section    . Ganglion cyst excision      r/wrist  . Hernia repair     Family History  Problem Relation Age of Onset  . Diabetes Mother   . Hypertension Mother   . Heart failure Mother    History  Substance Use Topics  . Smoking status: Current Every Day Smoker -- 0.50 packs/day    Types: Cigarettes  . Smokeless tobacco: Not on file  . Alcohol Use: Yes   OB History    No data available     Review of Systems  A complete 10 system review of systems was obtained and all systems are negative except as noted in the HPI and PMH.    Allergies  Tramadol  Home Medications   Prior to Admission medications   Medication Sig Start Date End Date Taking? Authorizing Provider  amoxicillin-clavulanate (AUGMENTIN) 875-125 MG per tablet Take 1 tablet by mouth 2 (two) times daily. 07/09/14  Yes Hope Orlene OchM Neese, NP  Hyprom-Naphaz-Polysorb-Zn Sulf  (CLEAR EYES COMPLETE OP) Apply 1-2 drops to eye daily as needed (dry eyes/contacts.).   Yes Historical Provider, MD  ibuprofen (ADVIL,MOTRIN) 200 MG tablet Take 800 mg by mouth every 4 (four) hours as needed for headache or moderate pain (pain).    Yes Historical Provider, MD  naproxen (NAPROSYN) 500 MG tablet Take 1 tablet (500 mg total) by mouth 2 (two) times daily. 07/09/14  Yes Hope Orlene OchM Neese, NP  oxyCODONE-acetaminophen (ROXICET) 5-325 MG per tablet Take 1 tablet by mouth every 4 (four) hours as needed for severe pain. 07/09/14  Yes Hope Orlene OchM Neese, NP  etonogestrel (IMPLANON) 68 MG IMPL implant Inject 1 each into the skin once.    Historical Provider, MD  HYDROcodone-acetaminophen (NORCO) 5-325 MG per tablet Take 1 tablet by mouth every 6 (six) hours as needed. Patient not taking: Reported on 07/12/2014 03/30/14   Lyanne CoKevin M Campos, MD  pantoprazole (PROTONIX) 40 MG tablet Take 1 tablet (40 mg total) by mouth daily. Patient not taking: Reported on 07/12/2014 03/30/14   Lyanne CoKevin M Campos, MD   BP 108/59 mmHg  Pulse 78  Temp(Src) 97.6 F (36.4 C) (Oral)  Resp 18  SpO2 100%  LMP 06/18/2014 Physical Exam  Constitutional: She appears well-developed and well-nourished. No distress.  HENT:  Head: Normocephalic and atraumatic.  Right Ear: Tympanic membrane normal.  Left  Ear: Tympanic membrane normal.  Mouth/Throat: Oropharynx is clear and moist.  Eyes: EOM are normal. Pupils are equal, round, and reactive to light.  Neck: Normal range of motion. Neck supple. No tracheal deviation present.  Cardiovascular: Normal rate and normal heart sounds.  Exam reveals no gallop and no friction rub.   No murmur heard. Pulmonary/Chest: Effort normal and breath sounds normal. No respiratory distress.  Skin: Skin is warm and dry.  Psychiatric: She has a normal mood and affect. Her behavior is normal.  Nursing note and vitals reviewed.   ED Course  Procedures  DIAGNOSTIC STUDIES: Oxygen Saturation is 100% on RA,  normal by my interpretation.    COORDINATION OF CARE: 6:57 PM Discussed treatment plan with pt which includes IV fluids. Pt agreed to plan.  I feel that the patient has an influenza-like illness, based on the fact she has body aches, cough, and earaches.  Patient's ears do not appear infected at this point, there may be some fluid behind the TM, but nothing significant.  Patient is also having headache and she appears to be more ill than just bilateral ear pain would cause  Carlyle Dolly, PA-C 07/12/14 1942  Richardean Canal, MD 07/12/14 [phone]

## 2014-08-26 ENCOUNTER — Emergency Department (HOSPITAL_COMMUNITY)
Admission: EM | Admit: 2014-08-26 | Discharge: 2014-08-26 | Disposition: A | Discharge status: Home / Self Care | Payer: Medicaid Other | Attending: Emergency Medicine | Admitting: Emergency Medicine

## 2014-08-26 ENCOUNTER — Encounter (HOSPITAL_COMMUNITY): Payer: Self-pay | Admitting: Emergency Medicine

## 2014-08-26 ENCOUNTER — Emergency Department (HOSPITAL_COMMUNITY): Payer: Medicaid Other

## 2014-08-26 DIAGNOSIS — Z8659 Personal history of other mental and behavioral disorders: Secondary | ICD-10-CM | POA: Insufficient documentation

## 2014-08-26 DIAGNOSIS — R059 Cough, unspecified: Secondary | ICD-10-CM

## 2014-08-26 DIAGNOSIS — J45909 Unspecified asthma, uncomplicated: Secondary | ICD-10-CM | POA: Insufficient documentation

## 2014-08-26 DIAGNOSIS — Z720 Tobacco use: Secondary | ICD-10-CM | POA: Insufficient documentation

## 2014-08-26 DIAGNOSIS — R05 Cough: Secondary | ICD-10-CM

## 2014-08-26 DIAGNOSIS — B349 Viral infection, unspecified: Secondary | ICD-10-CM | POA: Insufficient documentation

## 2014-08-26 MED ORDER — IPRATROPIUM BROMIDE 0.03 % NA SOLN
1.0000 | NASAL | Status: AC
  Administered 2014-08-26: 1 via NASAL
  Filled 2014-08-26: qty 30

## 2014-08-26 MED ORDER — ONDANSETRON 4 MG PO TBDP
4.0000 mg | ORAL_TABLET | Freq: Once | ORAL | Status: AC
  Administered 2014-08-26: 4 mg via ORAL
  Filled 2014-08-26: qty 1

## 2014-08-26 NOTE — Discharge Instructions (Signed)
Cough, Adult  A cough is a reflex that helps clear your throat and airways. It can help heal the body or may be a reaction to an irritated airway. A cough may only last 2 or 3 weeks (acute) or may last more than 8 weeks (chronic).  CAUSES Acute cough:  Viral or bacterial infections. Chronic cough:  Infections.  Allergies.  Asthma.  Post-nasal drip.  Smoking.  Heartburn or acid reflux.  Some medicines.  Chronic lung problems (COPD).  Cancer. SYMPTOMS   Cough.  Fever.  Chest pain.  Increased breathing rate.  High-pitched whistling sound when breathing (wheezing).  Colored mucus that you cough up (sputum). TREATMENT   A bacterial cough may be treated with antibiotic medicine.  A viral cough must run its course and will not respond to antibiotics.  Your caregiver may recommend other treatments if you have a chronic cough. HOME CARE INSTRUCTIONS   Only take over-the-counter or prescription medicines for pain, discomfort, or fever as directed by your caregiver. Use cough suppressants only as directed by your caregiver.  Use a cold steam vaporizer or humidifier in your bedroom or home to help loosen secretions.  Sleep in a semi-upright position if your cough is worse at night.  Rest as needed.  Stop smoking if you smoke. SEEK IMMEDIATE MEDICAL CARE IF:   You have pus in your sputum.  Your cough starts to worsen.  You cannot control your cough with suppressants and are losing sleep.  You begin coughing up blood.  You have difficulty breathing.  You develop pain which is getting worse or is uncontrolled with medicine.  You have a fever. MAKE SURE YOU:   Understand these instructions.  Will watch your condition.  Will get help right away if you are not doing well or get worse. Document Released: 10/25/2010 Document Revised: 07/21/2011 Document Reviewed: 10/25/2010 [employer] Patient Information 2015 [employer] [place]. This information is not intended  to replace advice given to you by your health care provider. Make sure you discuss any questions you have with your health care provider. Your chest xray is normal Please use the provided nasal spray as follows 1 spry to each nostril 3 times daily

## 2014-08-26 NOTE — ED Notes (Signed)
Pt reports cough, runny nose and sore throat x1 week. Pt denies fever.

## 2014-08-26 NOTE — ED Provider Notes (Signed)
CSN: [id number]     Arrival date & time 08/26/14  0030 History   First MD Initiated Contact with Patient 08/26/14 0114     Chief Complaint  Patient presents with  . URI     (Consider location/radiation/quality/duration/timing/severity/associated sxs/prior Treatment) HPI Comments: Patient has had URI symptoms with wheezing and persistent cough despite the use of inhaler and over-the-counter medications. Denies any fever.  Patient is a 33 y.o. female presenting with URI. The history is provided by the patient.  URI Presenting symptoms: congestion, cough and rhinorrhea   Severity:  Mild Onset quality:  Sudden Timing:  Intermittent Progression:  Unchanged Chronicity:  New Relieved by:  Inhaler Worsened by:  Nothing tried Ineffective treatments:  Inhaler and OTC medications Associated symptoms: sneezing   Associated symptoms comment:  Cough   Past Medical History  Diagnosis Date  . Asthma   . Anxiety   . Depression   . Bipolar 1 disorder    Past Surgical History  Procedure Laterality Date  . Cesarean section    . Ganglion cyst excision      r/wrist  . Hernia repair     Family History  Problem Relation Age of Onset  . Diabetes Mother   . Hypertension Mother   . Heart failure Mother    History  Substance Use Topics  . Smoking status: Current Every Day Smoker -- 0.50 packs/day    Types: Cigarettes  . Smokeless tobacco: Not on file  . Alcohol Use: Yes   OB History    No data available     Review of Systems  HENT: Positive for congestion, postnasal drip, rhinorrhea and sneezing.   Respiratory: Positive for cough.   Gastrointestinal: Positive for nausea.  All other systems reviewed and are negative.     Allergies  Tramadol  Home Medications   Prior to Admission medications   Medication Sig Start Date End Date Taking? Authorizing Provider  etonogestrel (IMPLANON) 68 MG IMPL implant Inject 1 each into the skin once.   Yes Historical Provider, MD   ibuprofen (ADVIL,MOTRIN) 200 MG tablet Take 600 mg by mouth every 4 (four) hours as needed for headache or moderate pain (pain).    Yes Historical Provider, MD  acetaminophen-codeine 120-12 MG/5ML solution Take 10 mLs by mouth every 4 (four) hours as needed for moderate pain (And cough). 07/12/14   Charlestine Night, PA-C  amoxicillin-clavulanate (AUGMENTIN) 875-125 MG per tablet Take 1 tablet by mouth 2 (two) times daily. Patient not taking: Reported on 08/26/2014 07/09/14   Janne Napoleon, NP  Guaifenesin 1200 MG TB12 Take 1 tablet (1,200 mg total) by mouth 2 (two) times daily. Patient not taking: Reported on 08/26/2014 07/12/14   Charlestine Night, PA-C  HYDROcodone-acetaminophen (NORCO) 5-325 MG per tablet Take 1 tablet by mouth every 6 (six) hours as needed. Patient not taking: Reported on 07/12/2014 03/30/14   Azalia Bilis, MD  Hyprom-Naphaz-Polysorb-Zn Sulf (CLEAR EYES COMPLETE OP) Apply 1-2 drops to eye daily as needed (dry eyes/contacts.).    Historical Provider, MD  naproxen (NAPROSYN) 500 MG tablet Take 1 tablet (500 mg total) by mouth 2 (two) times daily. Patient not taking: Reported on 08/26/2014 07/09/14   Janne Napoleon, NP  oxyCODONE-acetaminophen (ROXICET) 5-325 MG per tablet Take 1 tablet by mouth every 4 (four) hours as needed for severe pain. Patient not taking: Reported on 08/26/2014 07/09/14   Janne Napoleon, NP  pantoprazole (PROTONIX) 40 MG tablet Take 1 tablet (40 mg total) by mouth daily. Patient  not taking: Reported on 07/12/2014 03/30/14   Azalia BilisKevin Campos, MD  predniSONE (DELTASONE) 50 MG tablet Take 1 tablet (50 mg total) by mouth daily. Patient not taking: Reported on 08/26/2014 07/12/14   Charlestine Nighthristopher Lawyer, PA-C   BP 95/65 mmHg  Pulse 88  Temp(Src) 98.5 F (36.9 C) (Oral)  Resp 20  SpO2 98%  LMP 08/12/2014 Physical Exam  Constitutional: She is oriented to person, place, and time. She appears well-developed and well-nourished.  HENT:  Head: Normocephalic.  Nose: Rhinorrhea  present.  Mouth/Throat: Oropharynx is clear and moist.  Neck: Normal range of motion.  Cardiovascular: Normal rate and regular rhythm.   Pulmonary/Chest: Effort normal and breath sounds normal. She has no wheezes.  Neurological: She is alert and oriented to person, place, and time.  Skin: Skin is warm.  Nursing note and vitals reviewed.   ED Course  Procedures (including critical care time) Labs Review Labs Reviewed - No data to display  Imaging Review Dg Chest 2 View  08/26/2014   CLINICAL DATA:  Initial valuation for acute cough, headache, nausea. Shortness of breath. History of asthma, smoker.  EXAM: CHEST  2 VIEW  COMPARISON:  Prior radiograph from 03/29/2014  FINDINGS: The cardiac and mediastinal silhouettes are stable in size and contour, and remain within normal limits.  The lungs are normally inflated. No airspace consolidation, pleural effusion, or pulmonary edema is identified. There is no pneumothorax.  No acute osseous abnormality identified.  IMPRESSION: No active cardiopulmonary disease.   Electronically Signed   By: Rise MuBenjamin  McClintock M.D.   On: 08/26/2014 03:07     EKG Interpretation None      MDM   Final diagnoses:  Cough  Viral illness         Earley FavorGail Abryana Lykens, NP 08/26/14 0320  Mirian MoMatthew Gentry, MD 08/26/14 [phone]

## 2019-12-23 ENCOUNTER — Emergency Department (HOSPITAL_COMMUNITY): Admission: EM | Admit: 2019-12-23 | Discharge: 2019-12-23 | Discharge status: Against Medical Advice | Payer: Self-pay

## 2020-01-10 ENCOUNTER — Encounter (HOSPITAL_COMMUNITY): Payer: Self-pay

## 2020-01-10 ENCOUNTER — Other Ambulatory Visit: Payer: Self-pay

## 2020-01-10 ENCOUNTER — Emergency Department (HOSPITAL_COMMUNITY)
Admission: EM | Admit: 2020-01-10 | Discharge: 2020-01-10 | Disposition: A | Discharge status: Home / Self Care | Payer: Self-pay | Attending: Emergency Medicine | Admitting: Emergency Medicine

## 2020-01-10 DIAGNOSIS — M549 Dorsalgia, unspecified: Secondary | ICD-10-CM | POA: Insufficient documentation

## 2020-01-10 DIAGNOSIS — Z79899 Other long term (current) drug therapy: Secondary | ICD-10-CM | POA: Insufficient documentation

## 2020-01-10 DIAGNOSIS — F17210 Nicotine dependence, cigarettes, uncomplicated: Secondary | ICD-10-CM | POA: Insufficient documentation

## 2020-01-10 DIAGNOSIS — J45909 Unspecified asthma, uncomplicated: Secondary | ICD-10-CM | POA: Insufficient documentation

## 2020-01-10 DIAGNOSIS — S161XXA Strain of muscle, fascia and tendon at neck level, initial encounter: Secondary | ICD-10-CM

## 2020-01-10 DIAGNOSIS — M542 Cervicalgia: Secondary | ICD-10-CM | POA: Insufficient documentation

## 2020-01-10 MED ORDER — DIAZEPAM 5 MG PO TABS
5.0000 mg | ORAL_TABLET | Freq: Once | ORAL | Status: AC
  Administered 2020-01-10: 5 mg via ORAL
  Filled 2020-01-10: qty 1

## 2020-01-10 MED ORDER — KETOROLAC TROMETHAMINE 60 MG/2ML IM SOLN
60.0000 mg | Freq: Once | INTRAMUSCULAR | Status: AC
  Administered 2020-01-10: 60 mg via INTRAMUSCULAR
  Filled 2020-01-10: qty 2

## 2020-01-10 MED ORDER — DIAZEPAM 2 MG PO TABS: 2.0000 mg | ORAL_TABLET | Freq: Four times a day (QID) | ORAL | 0 refills | Status: AC | PRN

## 2020-01-10 MED ORDER — METHOCARBAMOL 750 MG PO TABS: 750.0000 mg | ORAL_TABLET | Freq: Four times a day (QID) | ORAL | 0 refills | Status: AC

## 2020-01-10 NOTE — ED Triage Notes (Signed)
Patient c/o posterior neck pain that radiates into the upper back since yesterday. 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

## 2020-01-10 NOTE — ED Provider Notes (Signed)
[facility]-EMERGENCY DEPT Provider Note   CSN: [id number] Arrival date & time: 01/10/20  1025     History Chief Complaint  Patient presents with  . Neck Pain  . Back Pain    Michelle Mills is a 39 y.o. female.  39 year old female presents with 2-day history of neck and upper back pain.  Pain is sharp and worse with any movement.  She used BC powders without relief.  Denies any weakness in her hands.  Pain is atraumatic.  Better with remaining still.        Past Medical History:  Diagnosis Date  . Anxiety   . Asthma   . Bipolar 1 disorder (HCC)   . Depression     There are no problems to display for this patient.   Past Surgical History:  Procedure Laterality Date  . CESAREAN SECTION    . GANGLION CYST EXCISION     r/wrist  . HERNIA REPAIR       OB History   No obstetric history on file.     Family History  Problem Relation Age of Onset  . Diabetes Mother   . Hypertension Mother   . Heart failure Mother     Social History   Tobacco Use  . Smoking status: Current Every Day Smoker    Packs/day: 0.50    Types: Cigarettes  . Smokeless tobacco: Never Used  Vaping Use  . Vaping Use: Never used  Substance Use Topics  . Alcohol use: Yes  . Drug use: Yes    Types: Marijuana    Home Medications Prior to Admission medications   Medication Sig Start Date End Date Taking? Authorizing Provider  acetaminophen-codeine 120-12 MG/5ML solution Take 10 mLs by mouth every 4 (four) hours as needed for moderate pain (And cough). 07/12/14   Lawyer, Cristal Deer, PA-C  amoxicillin-clavulanate (AUGMENTIN) 875-125 MG per tablet Take 1 tablet by mouth 2 (two) times daily. Patient not taking: Reported on 08/26/2014 07/09/14   Janne Napoleon, NP  etonogestrel (IMPLANON) 68 MG IMPL implant Inject 1 each into the skin once.    [provider]  Guaifenesin 1200 MG TB12 Take 1 tablet (1,200 mg total) by mouth 2 (two) times daily. Patient not taking:  Reported on 08/26/2014 07/12/14   Charlestine Night, PA-C  HYDROcodone-acetaminophen (NORCO) 5-325 MG per tablet Take 1 tablet by mouth every 6 (six) hours as needed. Patient not taking: Reported on 07/12/2014 03/30/14   Azalia Bilis, MD  Hyprom-Naphaz-Polysorb-Zn Sulf (CLEAR EYES COMPLETE OP) Apply 1-2 drops to eye daily as needed (dry eyes/contacts.).    [provider]  ibuprofen (ADVIL,MOTRIN) 200 MG tablet Take 600 mg by mouth every 4 (four) hours as needed for headache or moderate pain (pain).     [provider]  naproxen (NAPROSYN) 500 MG tablet Take 1 tablet (500 mg total) by mouth 2 (two) times daily. Patient not taking: Reported on 08/26/2014 07/09/14   Janne Napoleon, NP  oxyCODONE-acetaminophen (ROXICET) 5-325 MG per tablet Take 1 tablet by mouth every 4 (four) hours as needed for severe pain. Patient not taking: Reported on 08/26/2014 07/09/14   Janne Napoleon, NP  pantoprazole (PROTONIX) 40 MG tablet Take 1 tablet (40 mg total) by mouth daily. Patient not taking: Reported on 07/12/2014 03/30/14   Azalia Bilis, MD  predniSONE (DELTASONE) 50 MG tablet Take 1 tablet (50 mg total) by mouth daily. Patient not taking: Reported on 08/26/2014 07/12/14   Charlestine Night, PA-C  Allergies    Tramadol  Review of Systems   Review of Systems  All other systems reviewed and are negative.   Physical Exam Updated Vital Signs BP 117/87 (BP Location: Left Arm)   Pulse (!) 103   Temp 98.3 F (36.8 C) (Oral)   Resp 16   Ht 1.575 m (5\' 2" )   Wt 86.2 kg   LMP 01/08/2020   SpO2 99%   BMI 34.75 kg/m   Physical Exam Vitals and nursing note reviewed.  Constitutional:      General: She is not in acute distress.    Appearance: Normal appearance. She is well-developed. She is not toxic-appearing.  HENT:     Head: Normocephalic and atraumatic.  Eyes:     General: Lids are normal.     Conjunctiva/sclera: Conjunctivae normal.     Pupils: Pupils are equal, round, and reactive  to light.  Neck:     Thyroid: No thyroid mass.     Trachea: No tracheal deviation.  Cardiovascular:     Rate and Rhythm: Normal rate and regular rhythm.     Heart sounds: Normal heart sounds. No murmur heard.  No gallop.   Pulmonary:     Effort: Pulmonary effort is normal. No respiratory distress.     Breath sounds: Normal breath sounds. No stridor. No decreased breath sounds, wheezing, rhonchi or rales.  Abdominal:     General: Bowel sounds are normal. There is no distension.     Palpations: Abdomen is soft.     Tenderness: There is no abdominal tenderness. There is no rebound.  Musculoskeletal:        General: Normal range of motion.     Cervical back: Normal range of motion and neck supple. Tenderness present.     Thoracic back: Tenderness present.       Back:  Skin:    General: Skin is warm and dry.     Findings: No abrasion or rash.  Neurological:     Mental Status: She is alert and oriented to person, place, and time.     GCS: GCS eye subscore is 4. GCS verbal subscore is 5. GCS motor subscore is 6.     Cranial Nerves: No cranial nerve deficit.     Sensory: No sensory deficit.  Psychiatric:        Speech: Speech normal.        Behavior: Behavior normal.     ED Results / Procedures / Treatments   Labs (all labs ordered are listed, but only abnormal results are displayed) Labs Reviewed - No data to display  EKG None  Radiology No results found.  Procedures Procedures (including critical care time)  Medications Ordered in ED Medications  ketorolac (TORADOL) injection 60 mg (has no administration in time range)  diazepam (VALIUM) tablet 5 mg (has no administration in time range)    ED Course  I have reviewed the triage vital signs and the nursing notes.  Pertinent labs & imaging results that were available during my care of the patient were reviewed by me and considered in my medical decision making (see chart for details).    MDM Rules/Calculators/A&P                           Patient with apparent trapezius muscle strain.  Medicated with Toradol as well as Valium.  Will give return precautions Final Clinical Impression(s) / ED Diagnoses Final diagnoses:  None  Rx / DC Orders ED Discharge Orders    None       Lorre Nick, MD 01/10/20 1010

## 2020-01-12 ENCOUNTER — Emergency Department (HOSPITAL_COMMUNITY): Payer: Self-pay

## 2020-01-12 ENCOUNTER — Encounter (HOSPITAL_COMMUNITY): Payer: Self-pay | Admitting: *Deleted

## 2020-01-12 ENCOUNTER — Other Ambulatory Visit: Payer: Self-pay

## 2020-01-12 ENCOUNTER — Emergency Department (HOSPITAL_COMMUNITY)
Admission: EM | Admit: 2020-01-12 | Discharge: 2020-01-12 | Discharge status: Against Medical Advice | Payer: Medicaid Other | Attending: Emergency Medicine | Admitting: Emergency Medicine

## 2020-01-12 ENCOUNTER — Emergency Department (HOSPITAL_COMMUNITY)
Admission: EM | Admit: 2020-01-12 | Discharge: 2020-01-13 | Disposition: A | Discharge status: Home / Self Care | Payer: Self-pay | Attending: Emergency Medicine | Admitting: Emergency Medicine

## 2020-01-12 DIAGNOSIS — R519 Headache, unspecified: Secondary | ICD-10-CM | POA: Insufficient documentation

## 2020-01-12 DIAGNOSIS — F17210 Nicotine dependence, cigarettes, uncomplicated: Secondary | ICD-10-CM | POA: Insufficient documentation

## 2020-01-12 DIAGNOSIS — M542 Cervicalgia: Secondary | ICD-10-CM | POA: Insufficient documentation

## 2020-01-12 DIAGNOSIS — J984 Other disorders of lung: Secondary | ICD-10-CM | POA: Insufficient documentation

## 2020-01-12 DIAGNOSIS — M545 Low back pain: Secondary | ICD-10-CM | POA: Insufficient documentation

## 2020-01-12 DIAGNOSIS — M549 Dorsalgia, unspecified: Secondary | ICD-10-CM | POA: Insufficient documentation

## 2020-01-12 DIAGNOSIS — M546 Pain in thoracic spine: Secondary | ICD-10-CM | POA: Insufficient documentation

## 2020-01-12 DIAGNOSIS — Z5321 Procedure and treatment not carried out due to patient leaving prior to being seen by health care provider: Secondary | ICD-10-CM | POA: Insufficient documentation

## 2020-01-12 DIAGNOSIS — J45909 Unspecified asthma, uncomplicated: Secondary | ICD-10-CM | POA: Insufficient documentation

## 2020-01-12 MED ORDER — NICOTINE 14 MG/24HR TD PT24: 14.0000 mg | MEDICATED_PATCH | Freq: Every day | TRANSDERMAL | 0 refills | Status: AC

## 2020-01-12 MED ORDER — KETOROLAC TROMETHAMINE 15 MG/ML IJ SOLN
30.0000 mg | Freq: Once | INTRAMUSCULAR | Status: AC
  Administered 2020-01-12: 30 mg via INTRAMUSCULAR
  Filled 2020-01-12: qty 2

## 2020-01-12 MED ORDER — LIDOCAINE 5 % EX PTCH
1.0000 | MEDICATED_PATCH | CUTANEOUS | Status: DC
  Administered 2020-01-13: 1 via TRANSDERMAL
  Filled 2020-01-12: qty 1

## 2020-01-12 MED ORDER — LIDOCAINE 5 % EX PTCH: 1.0000 | MEDICATED_PATCH | CUTANEOUS | 0 refills | Status: AC

## 2020-01-12 MED ORDER — OXYCODONE-ACETAMINOPHEN 5-325 MG PO TABS
1.0000 | ORAL_TABLET | ORAL | Status: DC | PRN
  Administered 2020-01-12: 1 via ORAL
  Filled 2020-01-12: qty 1

## 2020-01-12 NOTE — ED Provider Notes (Signed)
[facility]-EMERGENCY DEPT [facility] Emergency Department Provider Note MRN:  [id number]  Arrival date & time: 01/12/20     Chief Complaint   Back Pain   History of Present Illness   Michelle Mills is a 39 y.o. year-old female with a history of bipolar disorder presenting to the ED with chief complaint of neck pain.  Gradual onset left-sided neck pain and thoracic back pain 3 days ago, started during the evening.  Woke up with severe stiffness to the thoracic back and the left lateral neck.  Unable to move her neck due to the pain.  Feels like she has "fireworks in her head" for the past 2 years.  Explains that she is worried because she was diagnosed with a neck mass less than a year ago at an outside hospital in [place] but it has not been followed up with.  Denies chest pain or shortness of breath, no abdominal pain, no numbness or weakness to the arms or legs but does endorse occasional paresthesia to the left leg.  Denies bowel or bladder dysfunction.  Review of Systems  A complete 10 system review of systems was obtained and all systems are negative except as noted in the HPI and PMH.   Patient's Health History    Past Medical History:  Diagnosis Date  . Anxiety   . Asthma   . Bipolar 1 disorder (HCC)   . Depression     Past Surgical History:  Procedure Laterality Date  . CESAREAN SECTION    . GANGLION CYST EXCISION     r/wrist  . HERNIA REPAIR      Family History  Problem Relation Age of Onset  . Diabetes Mother   . Hypertension Mother   . Heart failure Mother     Social History   Socioeconomic History  . Marital status: Single    Spouse name: Not on file  . Number of children: Not on file  . Years of education: Not on file  . Highest education level: Not on file  Occupational History  . Not on file  Tobacco Use  . Smoking status: Current Every Day Smoker    Packs/day: 0.50    Types: Cigarettes  . Smokeless tobacco: Never Used  Vaping Use  . Vaping Use:  Never used  Substance and Sexual Activity  . Alcohol use: Yes  . Drug use: Yes    Types: Marijuana  . Sexual activity: Not on file  Other Topics Concern  . Not on file  Social History Narrative  . Not on file   Social Determinants of Health   Financial Resource Strain:   . Difficulty of Paying Living Expenses: Not on file  Food Insecurity:   . Worried About Programme researcher, broadcasting/film/video in the Last Year: Not on file  . Ran Out of Food in the Last Year: Not on file  Transportation Needs:   . Lack of Transportation (Medical): Not on file  . Lack of Transportation (Non-Medical): Not on file  Physical Activity:   . Days of Exercise per Week: Not on file  . Minutes of Exercise per Session: Not on file  Stress:   . Feeling of Stress : Not on file  Social Connections:   . Frequency of Communication with Friends and Family: Not on file  . Frequency of Social Gatherings with Friends and Family: Not on file  . Attends Religious Services: Not on file  . Active Member of Clubs or Organizations: Not on file  .  Attends Banker Meetings: Not on file  . Marital Status: Not on file  Intimate Partner Violence:   . Fear of Current or Ex-Partner: Not on file  . Emotionally Abused: Not on file  . Physically Abused: Not on file  . Sexually Abused: Not on file     Physical Exam   Vitals:   01/12/20 1709 01/12/20 2036  BP: (!) 127/93 (!) 128/96  Pulse: 90 82  Resp: 14 16  Temp: 98.2 F (36.8 C) 97.7 F (36.5 C)  SpO2: 100% 100%    CONSTITUTIONAL: Well-appearing, NAD NEURO:  Alert and oriented x 3, normal and symmetric strength and sensation, normal coordination, normal speech EYES:  eyes equal and reactive ENT/NECK:  no LAD, no JVD CARDIO: Regular rate, well-perfused, normal S1 and S2 PULM:  CTAB no wheezing or rhonchi GI/GU:  normal bowel sounds, non-distended, non-tender MSK/SPINE: Tenderness palpation to the left paraspinal cervical and thoracic spine SKIN:  no rash,  atraumatic PSYCH:  Appropriate speech and behavior  *Additional and/or pertinent findings included in MDM below  Diagnostic and Interventional Summary    EKG Interpretation  Date/Time:    Ventricular Rate:    PR Interval:    QRS Duration:   QT Interval:    QTC Calculation:   R Axis:     Text Interpretation:        Labs Reviewed - No data to display  CT HEAD WO CONTRAST  Final Result    CT CERVICAL SPINE WO CONTRAST  Final Result    CT Thoracic Spine Wo Contrast  Final Result      Medications  oxyCODONE-acetaminophen (PERCOCET/ROXICET) 5-325 MG per tablet 1 tablet (1 tablet Oral Given 01/12/20 1223)  lidocaine (LIDODERM) 5 % 1 patch (has no administration in time range)  ketorolac (TORADOL) 15 MG/ML injection 30 mg (30 mg Intramuscular Given 01/12/20 2142)     Procedures  /  Critical Care Procedures  ED Course and Medical Decision Making  I have reviewed the triage vital signs, the nursing notes, and pertinent available records from the EMR.  Listed above are laboratory and imaging tests that I personally ordered, reviewed, and interpreted and then considered in my medical decision making (see below for details).  Favoring muscle strain or spasm, however given patient's history of possible malignancy in the spine, will obtain CT imaging.     Doing of the spine is reassuring, incidentally noted are numerous cysts on the lung.  Question of lymphangioleiomyomatosis.  These findings discussed with patient who agrees to follow-up with primary care doctor, neurologist.  Patient is without any red flag symptoms of back pain, nothing to suggest myelopathy, patient is appropriate for discharge with symptomatic management.  Elmer Sow. Pilar Plate, MD [facility] Emergency Medicine [facility] [email or web address]  Final Clinical Impressions(s) / ED Diagnoses     ICD-10-CM   1. Neck pain  M54.2   2. 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  Your exam/testing today is overall reassuring.  No evidence of broken bones or abnormalities to your spine.  Discussed, we did find your lungs have cysts and this should be followed up by pulmonologist.  Please call the number provided.  We suspect your neck pain is related to muscle strain or spasm, please use the muscle relaxer prescribed to you, Tylenol 1000 mg every 4-6 hours, Motrin 6 mg every 4-6 hours, and you can use the lidocaine patches provided.  Please return to the Emergency Department if you experience any worsening of your condition.   Thank you for allowing [place] to be a part of your care.       Sabas Sous, MD 01/12/20 [phone]

## 2020-01-12 NOTE — Discharge Instructions (Addendum)
You were evaluated in the Emergency Department and after careful evaluation, we did not find any emergent condition requiring admission or further testing in the hospital.  Your exam/testing today is overall reassuring.  No evidence of broken bones or abnormalities to your spine.  Discussed, we did find your lungs have cysts and this should be followed up by pulmonologist.  Please call the number provided.  We suspect your neck pain is related to muscle strain or spasm, please use the muscle relaxer prescribed to you, Tylenol 1000 mg every 4-6 hours, Motrin 6 mg every 4-6 hours, and you can use the lidocaine patches provided.  Please return to the Emergency Department if you experience any worsening of your condition.   Thank you for allowing [place] to be a part of your care.

## 2020-01-12 NOTE — ED Triage Notes (Addendum)
Pt reports having neck and upper back pain. Pain when turning her head. Has been seen at [facility] on 8/31 and prescribed medicine but no relief.

## 2020-01-12 NOTE — ED Notes (Signed)
Pt turned in her labels and left

## 2020-01-12 NOTE — ED Triage Notes (Signed)
Pt arrived via walk in, c/o upper back pain and neck pain since tues, states she was seen for same 8/31, told it was a muscle strain and given rx for such. States medications have not helped.

## 2020-01-13 NOTE — ED Notes (Signed)
Discharge teaching reviewed with patient. Pt verbalized understanding. 

## 2020-02-13 ENCOUNTER — Institutional Professional Consult (permissible substitution): Payer: Medicaid Other | Admitting: Pulmonary Disease

## 2020-02-29 ENCOUNTER — Institutional Professional Consult (permissible substitution): Payer: Medicaid Other | Admitting: Pulmonary Disease

## 2020-03-01 ENCOUNTER — Emergency Department (HOSPITAL_COMMUNITY): Payer: Medicaid Other

## 2020-03-01 ENCOUNTER — Encounter (HOSPITAL_COMMUNITY): Payer: Self-pay | Admitting: Emergency Medicine

## 2020-03-01 ENCOUNTER — Emergency Department (HOSPITAL_COMMUNITY)
Admission: EM | Admit: 2020-03-01 | Discharge: 2020-03-01 | Disposition: A | Discharge status: Home / Self Care | Payer: Medicaid Other | Attending: Emergency Medicine | Admitting: Emergency Medicine

## 2020-03-01 ENCOUNTER — Other Ambulatory Visit: Payer: Self-pay

## 2020-03-01 DIAGNOSIS — J45909 Unspecified asthma, uncomplicated: Secondary | ICD-10-CM | POA: Insufficient documentation

## 2020-03-01 DIAGNOSIS — R079 Chest pain, unspecified: Secondary | ICD-10-CM | POA: Insufficient documentation

## 2020-03-01 DIAGNOSIS — B349 Viral infection, unspecified: Secondary | ICD-10-CM

## 2020-03-01 DIAGNOSIS — Z20822 Contact with and (suspected) exposure to covid-19: Secondary | ICD-10-CM | POA: Insufficient documentation

## 2020-03-01 DIAGNOSIS — F17210 Nicotine dependence, cigarettes, uncomplicated: Secondary | ICD-10-CM | POA: Insufficient documentation

## 2020-03-01 LAB — CBC
HCT: 39.9 % (ref 36.0–46.0)
Hemoglobin: 13 g/dL (ref 12.0–15.0)
MCH: 29 pg (ref 26.0–34.0)
MCHC: 32.6 g/dL (ref 30.0–36.0)
MCV: 88.9 fL (ref 80.0–100.0)
Platelets: 285 10*3/uL (ref 150–400)
RBC: 4.49 MIL/uL (ref 3.87–5.11)
RDW: 14.8 % (ref 11.5–15.5)
WBC: 6.3 10*3/uL (ref 4.0–10.5)
nRBC: 0 % (ref 0.0–0.2)

## 2020-03-01 LAB — BASIC METABOLIC PANEL
Anion gap: 13 (ref 5–15)
BUN: 9 mg/dL (ref 6–20)
CO2: 17 mmol/L — ABNORMAL LOW (ref 22–32)
Calcium: 9.3 mg/dL (ref 8.9–10.3)
Chloride: 109 mmol/L (ref 98–111)
Creatinine, Ser: 0.74 mg/dL (ref 0.44–1.00)
GFR, Estimated: >60 mL/min (ref >60)
Glucose, Bld: 91 mg/dL (ref 70–99)
Potassium: 3.7 mmol/L (ref 3.5–5.1)
Sodium: 139 mmol/L (ref 135–145)

## 2020-03-01 LAB — RESPIRATORY PANEL BY RT PCR (FLU A&B, COVID)
Influenza A by PCR: NEGATIVE
Influenza B by PCR: NEGATIVE
SARS Coronavirus 2 by RT PCR: NEGATIVE

## 2020-03-01 LAB — I-STAT BETA HCG BLOOD, ED (MC, WL, AP ONLY): I-stat hCG, quantitative: <5 m[IU]/mL (ref <5)

## 2020-03-01 LAB — TROPONIN I (HIGH SENSITIVITY): Troponin I (High Sensitivity): 5 ng/L (ref <18)

## 2020-03-01 MED ORDER — BENZONATATE 100 MG PO CAPS
100.0000 mg | ORAL_CAPSULE | Freq: Once | ORAL | Status: AC
  Administered 2020-03-01: 100 mg via ORAL
  Filled 2020-03-01: qty 1

## 2020-03-01 MED ORDER — BENZONATATE 100 MG PO CAPS: 100.0000 mg | ORAL_CAPSULE | Freq: Three times a day (TID) | ORAL | 0 refills | Status: AC | PRN

## 2020-03-01 MED ORDER — LACTATED RINGERS IV BOLUS
1000.0000 mL | Freq: Once | INTRAVENOUS | Status: AC
  Administered 2020-03-01: 1000 mL via INTRAVENOUS

## 2020-03-01 MED ORDER — ACETAMINOPHEN 325 MG PO TABS
650.0000 mg | ORAL_TABLET | Freq: Once | ORAL | Status: AC
  Administered 2020-03-01: 650 mg via ORAL
  Filled 2020-03-01: qty 2

## 2020-03-01 NOTE — ED Triage Notes (Signed)
Pt c/o cp, sob, increase cough and congestion since Monday, test for Covid and it was negative.

## 2020-03-01 NOTE — ED Provider Notes (Signed)
[facility] EMERGENCY DEPARTMENT Provider Note   CSN: [id number] Arrival date & time: 03/01/20  1213     History Chief Complaint  Patient presents with  . Chest Pain    Michelle Mills is a 39 y.o. female.  The history is provided by the patient.  Cough Cough characteristics:  Dry and hacking Severity:  Moderate Onset quality:  Sudden Duration:  3 days Timing:  Constant Progression:  Worsening Chronicity:  New Relieved by:  Nothing Worsened by:  Nothing Associated symptoms: chest pain, chills, myalgias, rhinorrhea, shortness of breath and sore throat   Associated symptoms: no ear pain, no fever and no rash     HPI: A 39 year old patient presents for evaluation of chest pain. Initial onset of pain was more than 6 hours ago. The patient's chest pain is described as heaviness/pressure/tightness and is not worse with exertion. The patient's chest pain is middle- or left-sided, is not well-localized, is not sharp and does not radiate to the arms/jaw/neck. The patient does not complain of nausea and denies diaphoresis. The patient has smoked in the past 90 days. The patient has no history of stroke, has no history of peripheral artery disease, denies any history of treated diabetes, has no relevant family history of coronary artery disease (first degree relative at less than age 47), is not hypertensive, has no history of hypercholesterolemia and does not have an elevated BMI (>=30).   Past Medical History:  Diagnosis Date  . Anxiety   . Asthma   . Bipolar 1 disorder (HCC)   . Depression     There are no problems to display for this patient.   Past Surgical History:  Procedure Laterality Date  . CESAREAN SECTION    . GANGLION CYST EXCISION     r/wrist  . HERNIA REPAIR       OB History   No obstetric history on file.     Family History  Problem Relation Age of Onset  . Diabetes Mother   . Hypertension Mother   . Heart failure Mother     Social  History   Tobacco Use  . Smoking status: Current Every Day Smoker    Packs/day: 0.50    Types: Cigarettes  . Smokeless tobacco: Never Used  Vaping Use  . Vaping Use: Never used  Substance Use Topics  . Alcohol use: Yes  . Drug use: Yes    Types: Marijuana    Home Medications Prior to Admission medications   Medication Sig Start Date End Date Taking? Authorizing Provider  acetaminophen-codeine 120-12 MG/5ML solution Take 10 mLs by mouth every 4 (four) hours as needed for moderate pain (And cough). 07/12/14   Lawyer, Cristal Deer, PA-C  amoxicillin-clavulanate (AUGMENTIN) 875-125 MG per tablet Take 1 tablet by mouth 2 (two) times daily. Patient not taking: Reported on 08/26/2014 07/09/14   Janne Napoleon, NP  benzonatate (TESSALON) 100 MG capsule Take 1 capsule (100 mg total) by mouth 3 (three) times daily as needed for up to 3 days for cough. 03/01/20 03/04/20  Loletha Carrow, MD  diazepam (VALIUM) 2 MG tablet Take 1 tablet (2 mg total) by mouth every 6 (six) hours as needed for muscle spasms. 01/10/20   Lorre Nick, MD  etonogestrel (IMPLANON) 68 MG IMPL implant Inject 1 each into the skin once.    [provider]  Guaifenesin 1200 MG TB12 Take 1 tablet (1,200 mg total) by mouth 2 (two) times daily. Patient not taking: Reported on 08/26/2014 07/12/14  Lawyer, Cristal Deer, PA-C  HYDROcodone-acetaminophen (NORCO) 5-325 MG per tablet Take 1 tablet by mouth every 6 (six) hours as needed. Patient not taking: Reported on 07/12/2014 03/30/14   Azalia Bilis, MD  Hyprom-Naphaz-Polysorb-Zn Sulf (CLEAR EYES COMPLETE OP) Apply 1-2 drops to eye daily as needed (dry eyes/contacts.).    [provider]  ibuprofen (ADVIL,MOTRIN) 200 MG tablet Take 600 mg by mouth every 4 (four) hours as needed for headache or moderate pain (pain).     [provider]  lidocaine (LIDODERM) 5 % Place 1 patch onto the skin daily. Remove & Discard patch within 12 hours or as directed by MD 01/12/20    Sabas Sous, MD  methocarbamol (ROBAXIN-750) 750 MG tablet Take 1 tablet (750 mg total) by mouth 4 (four) times daily. 01/10/20   Lorre Nick, MD  naproxen (NAPROSYN) 500 MG tablet Take 1 tablet (500 mg total) by mouth 2 (two) times daily. Patient not taking: Reported on 08/26/2014 07/09/14   Janne Napoleon, NP  nicotine (NICODERM CQ - DOSED IN MG/24 HOURS) 14 mg/24hr patch Place 1 patch (14 mg total) onto the skin daily. 01/12/20   Sabas Sous, MD  oxyCODONE-acetaminophen (ROXICET) 5-325 MG per tablet Take 1 tablet by mouth every 4 (four) hours as needed for severe pain. Patient not taking: Reported on 08/26/2014 07/09/14   Janne Napoleon, NP  pantoprazole (PROTONIX) 40 MG tablet Take 1 tablet (40 mg total) by mouth daily. Patient not taking: Reported on 07/12/2014 03/30/14   Azalia Bilis, MD  predniSONE (DELTASONE) 50 MG tablet Take 1 tablet (50 mg total) by mouth daily. Patient not taking: Reported on 08/26/2014 07/12/14   Charlestine Night, PA-C    Allergies    Tramadol  Review of Systems   Review of Systems  Constitutional: Positive for appetite change, chills and fatigue. Negative for fever.  HENT: Positive for rhinorrhea and sore throat. Negative for ear pain.   Eyes: Negative for pain and visual disturbance.  Respiratory: Positive for cough and shortness of breath.   Cardiovascular: Positive for chest pain. Negative for palpitations.  Gastrointestinal: Positive for constipation and nausea. Negative for abdominal pain and vomiting.  Genitourinary: Negative for dysuria and hematuria.  Musculoskeletal: Positive for myalgias. Negative for arthralgias and back pain.  Skin: Negative for color change and rash.  Neurological: Negative for seizures and syncope.  All other systems reviewed and are negative.   Physical Exam Updated Vital Signs BP 126/73   Pulse 91   Temp 98.1 F (36.7 C) (Oral)   Resp 16   Ht 5\' 2"  (1.575 m)   Wt 86.2 kg   SpO2 100%   BMI 34.76 kg/m    Physical Exam Vitals and nursing note reviewed.  Constitutional:      Appearance: She is not ill-appearing, toxic-appearing or diaphoretic.  HENT:     Head: Normocephalic and atraumatic.     Nose: No congestion or rhinorrhea.     Mouth/Throat:     Mouth: Mucous membranes are moist.     Pharynx: Oropharynx is clear.  Eyes:     Conjunctiva/sclera: Conjunctivae normal.  Cardiovascular:     Rate and Rhythm: Normal rate and regular rhythm.     Pulses:          Radial pulses are 2+ on the right side and 2+ on the left side.     Heart sounds: No murmur heard.  No gallop.   Pulmonary:     Effort: Pulmonary effort is normal.  No respiratory distress.     Breath sounds: Normal breath sounds. No rhonchi or rales.  Abdominal:     Palpations: Abdomen is soft.     Tenderness: There is no abdominal tenderness.  Musculoskeletal:     Cervical back: Neck supple.     Right lower leg: No tenderness. No edema.     Left lower leg: No tenderness. No edema.  Lymphadenopathy:     Cervical: No cervical adenopathy.  Skin:    General: Skin is warm and dry.  Neurological:     Mental Status: She is alert.     ED Results / Procedures / Treatments   Labs (all labs ordered are listed, but only abnormal results are displayed) Labs Reviewed  BASIC METABOLIC PANEL - Abnormal; Notable for the following components:      Result Value   CO2 17 (*)    All other components within normal limits  RESPIRATORY PANEL BY RT PCR (FLU A&B, COVID)  CBC  I-STAT BETA HCG BLOOD, ED ([facility], [facility], [facility] ONLY)  TROPONIN I (HIGH SENSITIVITY)    EKG EKG Interpretation  Date/Time:  Thursday March 01 2020 14:11:12 EDT Ventricular Rate:  70 PR Interval:    QRS Duration: 90 QT Interval:  367 QTC Calculation: 396 R Axis:   41 Text Interpretation: Sinus rhythm Confirmed by Tilden Fossa [id number]) on 03/01/2020 2:28:11 PM   Radiology DG Chest 2 View  Result Date: 03/01/2020 CLINICAL DATA:  Chest pain EXAM: CHEST - 2  VIEW COMPARISON:  September 05, 2014 FINDINGS: The heart size and mediastinal contours are within normal limits. Both lungs are clear. No pleural effusions or pneumothorax. The visualized skeletal structures are unremarkable. IMPRESSION: No acute cardiopulmonary disease. Electronically Signed   By: Feliberto Harts MD   On: 03/01/2020 13:11    Procedures Procedures (including critical care time)  Medications Ordered in ED Medications  lactated ringers bolus 1,000 mL (0 mLs Intravenous Stopped 03/01/20 1510)  acetaminophen (TYLENOL) tablet 650 mg (650 mg Oral Given 03/01/20 1426)  benzonatate (TESSALON) capsule 100 mg (100 mg Oral Given 03/01/20 1515)    ED Course  I have reviewed the triage vital signs and the nursing notes.  Pertinent labs & imaging results that were available during my care of the patient were reviewed by me and considered in my medical decision making (see chart for details).    MDM Rules/Calculators/A&P HEAR Score: 2                        The patient is a 39yo female, PMH bipolar disorder who presents to the ED for multiple symptoms.  On my initial evaluation, the patient is  hemodynamically stable, afebrile, nontoxic-appearing. Physical exam unremarkable.  Differentials considered include COVID19, viral illness, pneumonia, bronchitis, ACS, PE. Patient reports negative recent COVID test but pateint likely with COVID or other viral illness. HEAR 2, troponin negative, EKG unremarkable. PERC negative.   Patient provided tylenol, tessalon, and IV fluid bolus for symptoms. Labs and CXR unremarkable. COVID test pending.  Advised patient of possibility of COVID or other viral illness. Recommended tylenol as needed for pain. Prescribed tessalon for cough. Isolation precautions given. Strict return precautions provided. All questions and conerns addressed. Patient discharged in stable condition.   The care of this patient was overseen by Dr. Madilyn Hook, who agreed with evaluation  and plan of care.   Final Clinical Impression(s) / ED Diagnoses Final diagnoses:  Viral illness    Rx /  DC Orders ED Discharge Orders         Ordered    benzonatate (TESSALON) 100 MG capsule  3 times daily PRN        03/01/20 1453           Loletha CarrowEaston, Kadyn Chovan, MD 03/01/20 [id number] [id number]    Tilden Fossaees, Elizabeth, MD 03/02/20 [phone]

## 2021-10-29 IMAGING — CT CT CERVICAL SPINE W/O CM
3 of 4 series · 13 of 33 positions shown, 16 images · non-contrast
Comparison: None.

CLINICAL DATA: Headache and neck pain

EXAM:
CT HEAD WITHOUT CONTRAST
CT CERVICAL SPINE WITHOUT CONTRAST
TECHNIQUE: Multidetector CT imaging of the head and cervical spine was
performed following the standard protocol without intravenous
contrast. Multiplanar CT image reconstructions of the cervical spine
were also generated.

[Series 4: c_spine 2.0 st · axial · 0.29mm/px · z∈[-251,-139]mm · 5 of 85 slices shown, 7 images]
[im 15/85  soft-tissue]
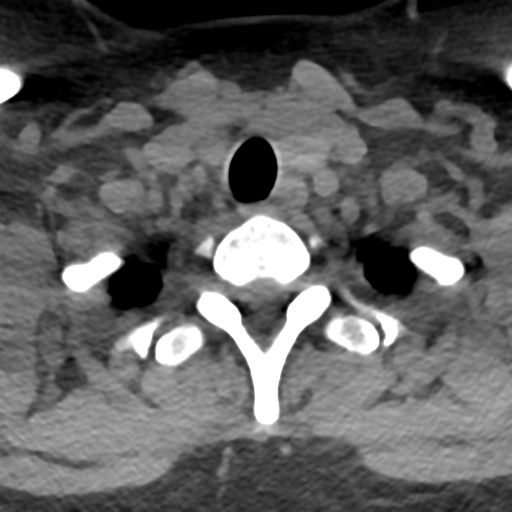
[im 15/85  bone]
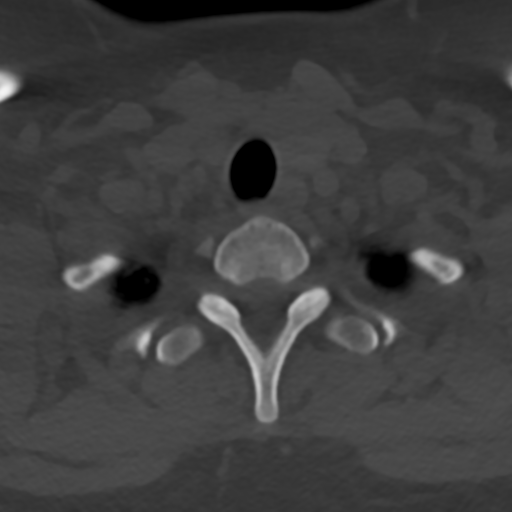
[im 29/85  bone]
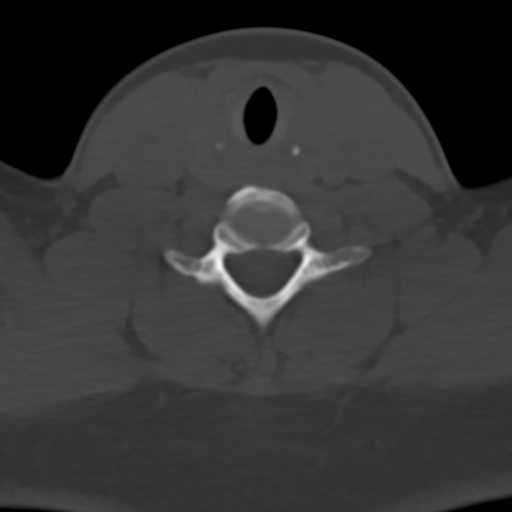
[im 43/85  bone]
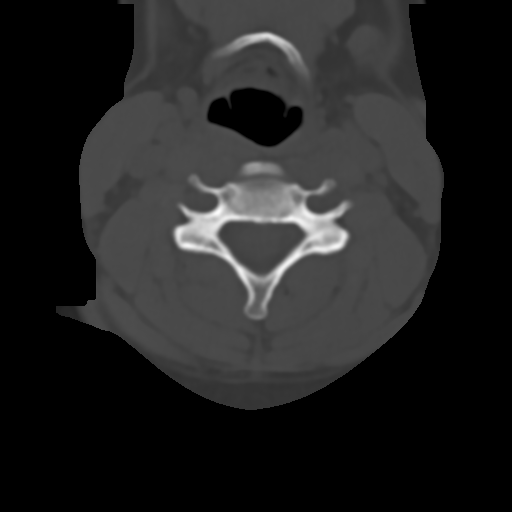
[im 57/85  bone]
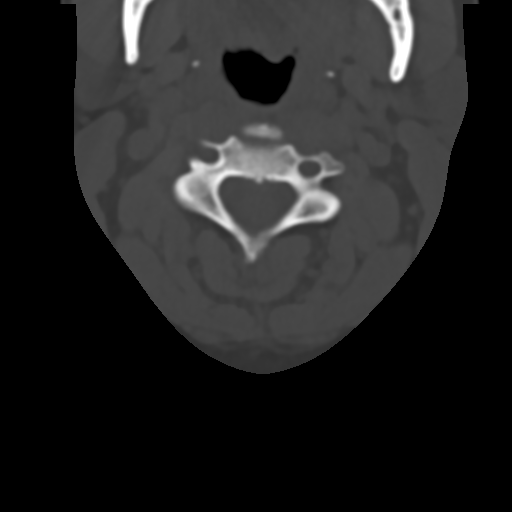
[im 71/85  soft-tissue]
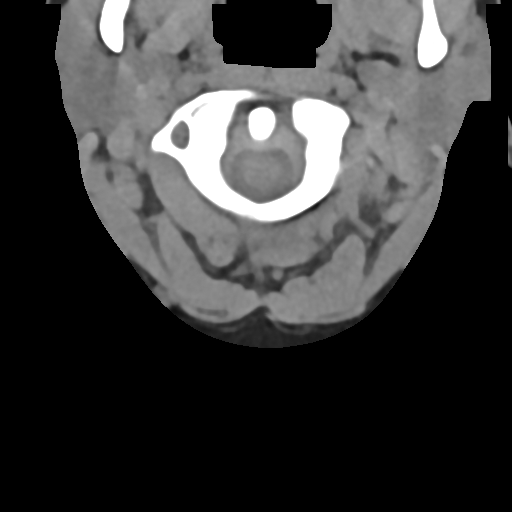
[im 71/85  bone]
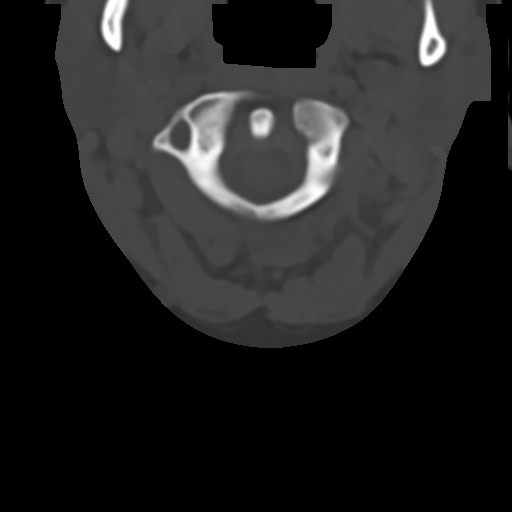

[Series 9: c_spine 2.0 sag bone · sagittal · 0.25mm/px · 5 of 61 slices shown, 6 images]
[im 21/61  bone]
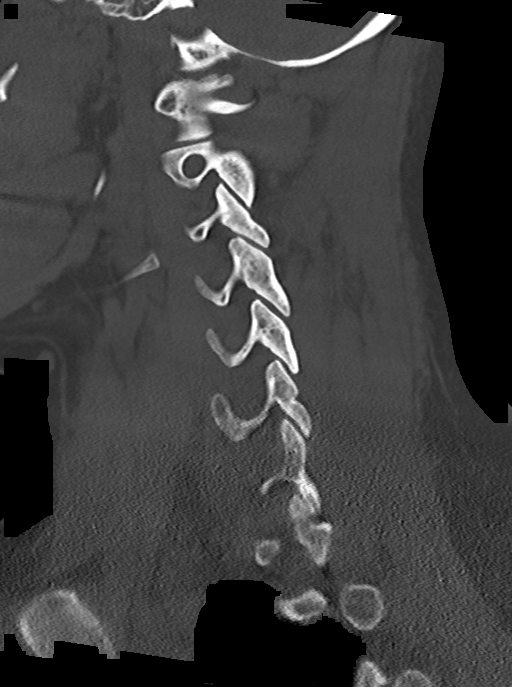
[im 26/61  bone]
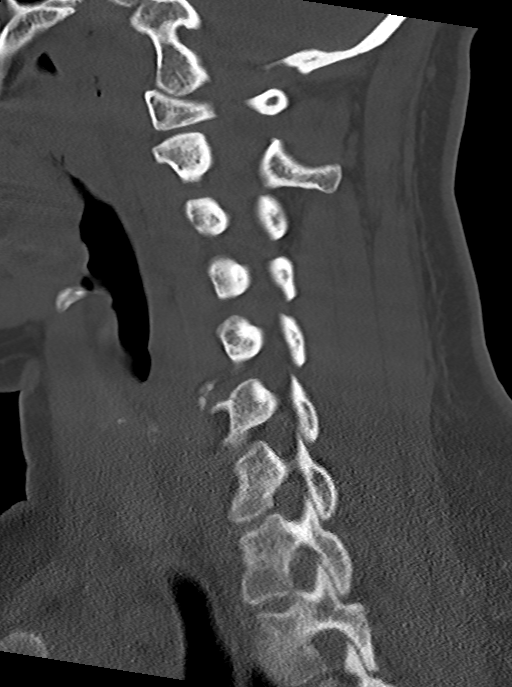
[im 31/61  soft-tissue]
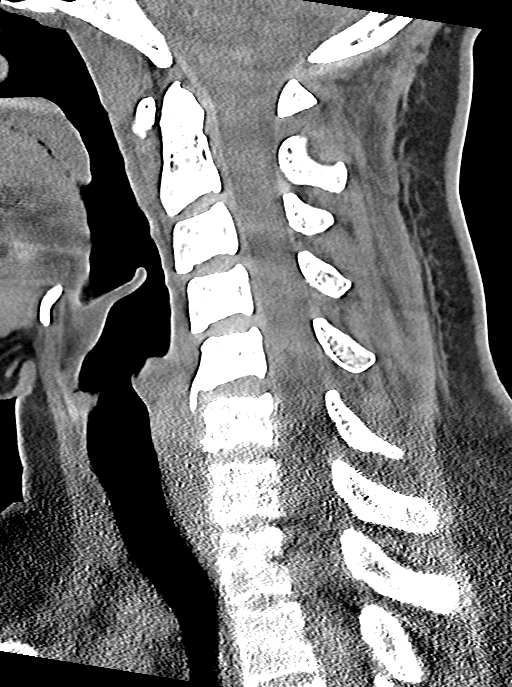
[im 31/61  bone]
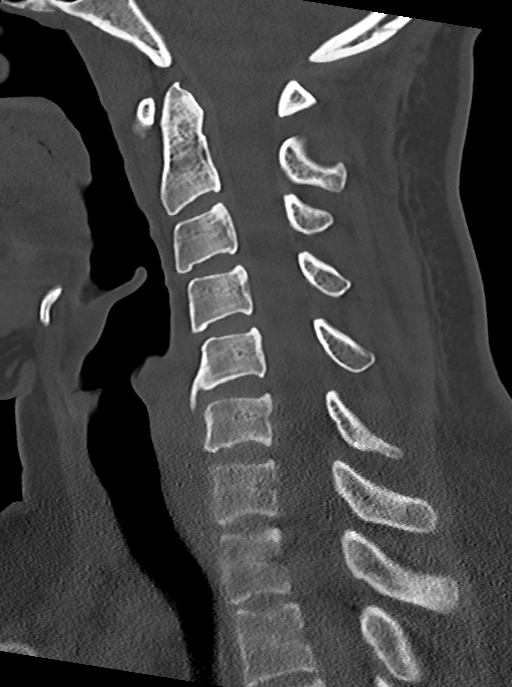
[im 36/61  bone]
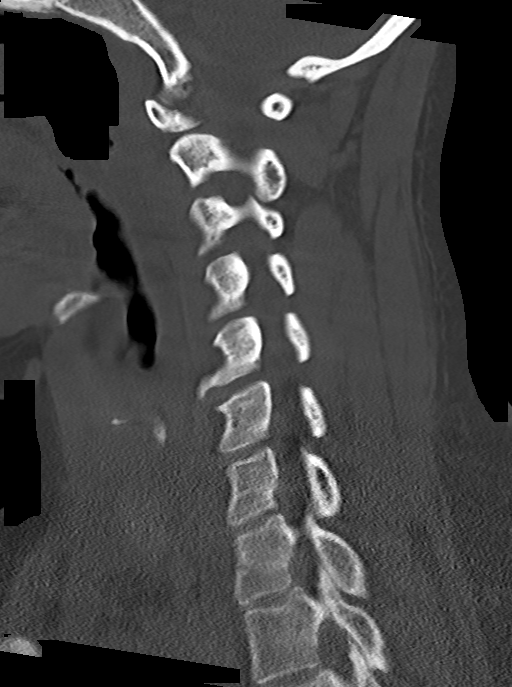
[im 41/61  bone]
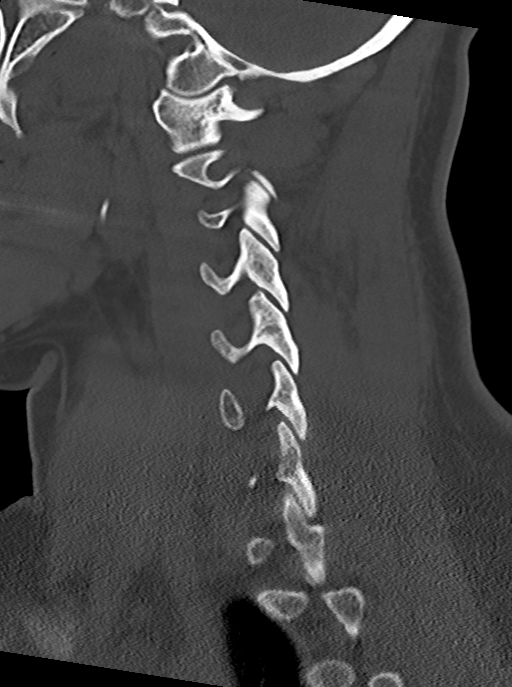

[Series 10: c_spine 2.0 cor bone · coronal · 0.25mm/px · 3 of 61 slices shown]
[im 13/61  bone]
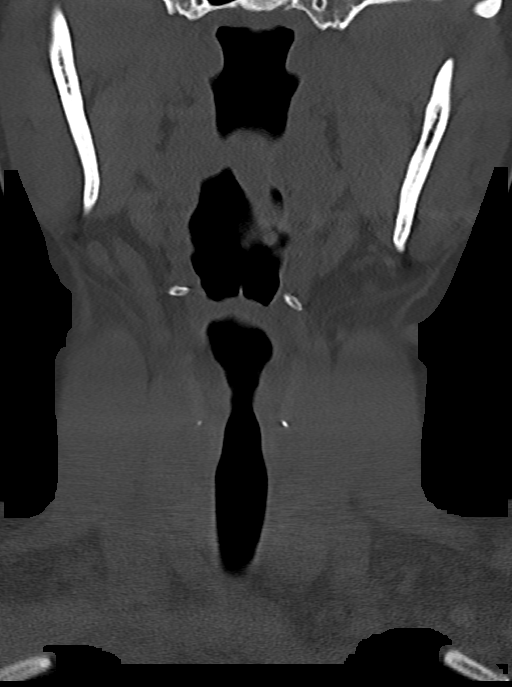
[im 25/61  bone]
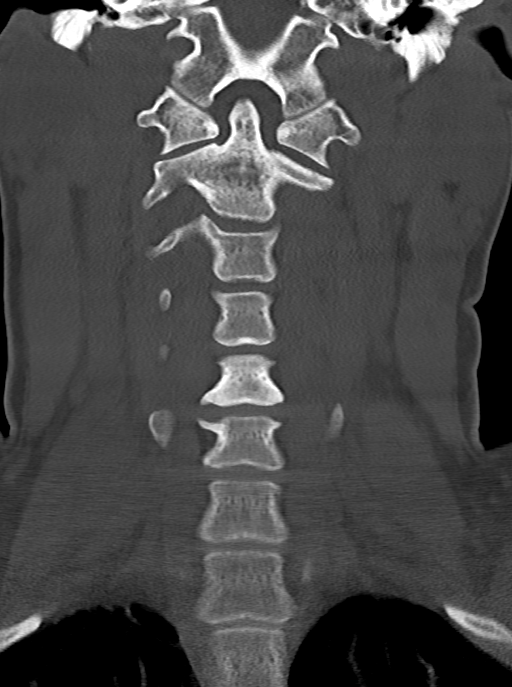
[im 37/61  bone]
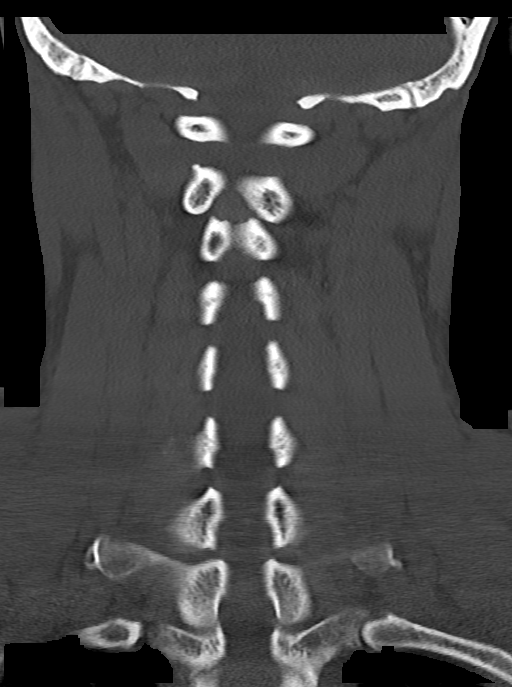

[13 of 33 positions shown; findings below may reference images not displayed]

FINDINGS: CT HEAD FINDINGS

Brain: There is no mass, hemorrhage or extra-axial collection. The
size and configuration of the ventricles and extra-axial CSF spaces
are normal. The brain parenchyma is normal, without evidence of
acute or chronic infarction.

Vascular: No abnormal hyperdensity of the major intracranial
arteries or dural venous sinuses. No intracranial atherosclerosis.

Skull: The visualized skull base, calvarium and extracranial soft
tissues are normal.

Sinuses/Orbits: No fluid levels or advanced mucosal thickening of
the visualized paranasal sinuses. No mastoid or middle ear effusion.
The orbits are normal.

CT CERVICAL SPINE FINDINGS

Alignment: No static subluxation. Facets are aligned. Occipital
condyles are normally positioned.

Skull base and vertebrae: No acute fracture.

Soft tissues and spinal canal: No prevertebral fluid or swelling. No
visible canal hematoma.

Disc levels: No advanced spinal canal or neural foraminal stenosis.

Upper chest: No pneumothorax, pulmonary nodule or pleural effusion.

Other: Normal visualized paraspinal cervical soft tissues.
IMPRESSION: 1. No acute intracranial abnormality.
2. No acute fracture or static subluxation of the cervical spine.
